# Patient Record
Sex: Male | Born: 1960 | Race: White | Hispanic: No | Smoking: Former smoker
Health system: Southern US, Community
[De-identification: ages and names within clinical notes are randomized; demographics above are authoritative.]

## PROBLEM LIST (undated history)

## (undated) DIAGNOSIS — E1110 Type 2 diabetes mellitus with ketoacidosis without coma: Secondary | ICD-10-CM

## (undated) DIAGNOSIS — A0472 Enterocolitis due to Clostridium difficile, not specified as recurrent: Secondary | ICD-10-CM

## (undated) DIAGNOSIS — F319 Bipolar disorder, unspecified: Secondary | ICD-10-CM

## (undated) DIAGNOSIS — L8990 Pressure ulcer of unspecified site, unspecified stage: Secondary | ICD-10-CM

## (undated) DIAGNOSIS — E119 Type 2 diabetes mellitus without complications: Secondary | ICD-10-CM

## (undated) DIAGNOSIS — G9341 Metabolic encephalopathy: Secondary | ICD-10-CM

## (undated) DIAGNOSIS — J9690 Respiratory failure, unspecified, unspecified whether with hypoxia or hypercapnia: Secondary | ICD-10-CM

---

## 2017-04-15 ENCOUNTER — Emergency Department (HOSPITAL_COMMUNITY): Payer: Medicare Other

## 2017-04-15 ENCOUNTER — Encounter (HOSPITAL_COMMUNITY): Payer: Self-pay | Admitting: *Deleted

## 2017-04-15 ENCOUNTER — Inpatient Hospital Stay (HOSPITAL_COMMUNITY)
Admission: EM | Admit: 2017-04-15 | Discharge: 2017-04-20 | DRG: 177 | Disposition: A | Discharge status: Home Health | Payer: Medicare Other | Attending: Internal Medicine | Admitting: Internal Medicine

## 2017-04-15 DIAGNOSIS — F3111 Bipolar disorder, current episode manic without psychotic features, mild: Secondary | ICD-10-CM | POA: Diagnosis not present

## 2017-04-15 DIAGNOSIS — Z87891 Personal history of nicotine dependence: Secondary | ICD-10-CM | POA: Diagnosis not present

## 2017-04-15 DIAGNOSIS — Z86718 Personal history of other venous thrombosis and embolism: Secondary | ICD-10-CM | POA: Diagnosis not present

## 2017-04-15 DIAGNOSIS — Z659 Problem related to unspecified psychosocial circumstances: Secondary | ICD-10-CM | POA: Diagnosis not present

## 2017-04-15 DIAGNOSIS — Z66 Do not resuscitate: Secondary | ICD-10-CM | POA: Diagnosis not present

## 2017-04-15 DIAGNOSIS — J189 Pneumonia, unspecified organism: Secondary | ICD-10-CM | POA: Diagnosis present

## 2017-04-15 DIAGNOSIS — J181 Lobar pneumonia, unspecified organism: Secondary | ICD-10-CM | POA: Diagnosis not present

## 2017-04-15 DIAGNOSIS — Z794 Long term (current) use of insulin: Secondary | ICD-10-CM

## 2017-04-15 DIAGNOSIS — Z6826 Body mass index (BMI) 26.0-26.9, adult: Secondary | ICD-10-CM

## 2017-04-15 DIAGNOSIS — J3800 Paralysis of vocal cords and larynx, unspecified: Secondary | ICD-10-CM | POA: Diagnosis present

## 2017-04-15 DIAGNOSIS — Z931 Gastrostomy status: Secondary | ICD-10-CM | POA: Diagnosis not present

## 2017-04-15 DIAGNOSIS — Z7901 Long term (current) use of anticoagulants: Secondary | ICD-10-CM

## 2017-04-15 DIAGNOSIS — D649 Anemia, unspecified: Secondary | ICD-10-CM | POA: Diagnosis not present

## 2017-04-15 DIAGNOSIS — E1142 Type 2 diabetes mellitus with diabetic polyneuropathy: Secondary | ICD-10-CM | POA: Diagnosis present

## 2017-04-15 DIAGNOSIS — Z79891 Long term (current) use of opiate analgesic: Secondary | ICD-10-CM

## 2017-04-15 DIAGNOSIS — Z9981 Dependence on supplemental oxygen: Secondary | ICD-10-CM

## 2017-04-15 DIAGNOSIS — Y95 Nosocomial condition: Secondary | ICD-10-CM | POA: Diagnosis present

## 2017-04-15 DIAGNOSIS — J9621 Acute and chronic respiratory failure with hypoxia: Secondary | ICD-10-CM | POA: Diagnosis present

## 2017-04-15 DIAGNOSIS — R1310 Dysphagia, unspecified: Secondary | ICD-10-CM | POA: Diagnosis present

## 2017-04-15 DIAGNOSIS — Z930 Tracheostomy status: Secondary | ICD-10-CM

## 2017-04-15 DIAGNOSIS — G894 Chronic pain syndrome: Secondary | ICD-10-CM | POA: Diagnosis not present

## 2017-04-15 DIAGNOSIS — E860 Dehydration: Secondary | ICD-10-CM | POA: Diagnosis present

## 2017-04-15 DIAGNOSIS — R1110 Vomiting, unspecified: Secondary | ICD-10-CM | POA: Diagnosis not present

## 2017-04-15 DIAGNOSIS — Z95828 Presence of other vascular implants and grafts: Secondary | ICD-10-CM

## 2017-04-15 DIAGNOSIS — G8929 Other chronic pain: Secondary | ICD-10-CM | POA: Diagnosis present

## 2017-04-15 DIAGNOSIS — R109 Unspecified abdominal pain: Secondary | ICD-10-CM

## 2017-04-15 DIAGNOSIS — Z9884 Bariatric surgery status: Secondary | ICD-10-CM | POA: Diagnosis not present

## 2017-04-15 DIAGNOSIS — L89323 Pressure ulcer of left buttock, stage 3: Secondary | ICD-10-CM | POA: Diagnosis present

## 2017-04-15 DIAGNOSIS — R491 Aphonia: Secondary | ICD-10-CM | POA: Diagnosis present

## 2017-04-15 DIAGNOSIS — R531 Weakness: Secondary | ICD-10-CM

## 2017-04-15 DIAGNOSIS — I959 Hypotension, unspecified: Secondary | ICD-10-CM | POA: Diagnosis present

## 2017-04-15 DIAGNOSIS — L89223 Pressure ulcer of left hip, stage 3: Secondary | ICD-10-CM | POA: Diagnosis present

## 2017-04-15 DIAGNOSIS — J9611 Chronic respiratory failure with hypoxia: Secondary | ICD-10-CM | POA: Diagnosis not present

## 2017-04-15 DIAGNOSIS — R8271 Bacteriuria: Secondary | ICD-10-CM | POA: Diagnosis present

## 2017-04-15 DIAGNOSIS — J690 Pneumonitis due to inhalation of food and vomit: Principal | ICD-10-CM | POA: Diagnosis present

## 2017-04-15 DIAGNOSIS — E43 Unspecified severe protein-calorie malnutrition: Secondary | ICD-10-CM | POA: Diagnosis present

## 2017-04-15 DIAGNOSIS — E119 Type 2 diabetes mellitus without complications: Secondary | ICD-10-CM | POA: Diagnosis not present

## 2017-04-15 DIAGNOSIS — F319 Bipolar disorder, unspecified: Secondary | ICD-10-CM | POA: Diagnosis present

## 2017-04-15 DIAGNOSIS — I69354 Hemiplegia and hemiparesis following cerebral infarction affecting left non-dominant side: Secondary | ICD-10-CM

## 2017-04-15 DIAGNOSIS — Z9181 History of falling: Secondary | ICD-10-CM

## 2017-04-15 HISTORY — DX: Enterocolitis due to Clostridium difficile, not specified as recurrent: A04.72

## 2017-04-15 HISTORY — DX: Respiratory failure, unspecified, unspecified whether with hypoxia or hypercapnia: J96.90

## 2017-04-15 HISTORY — DX: Type 2 diabetes mellitus with ketoacidosis without coma: E11.10

## 2017-04-15 HISTORY — DX: Pressure ulcer of unspecified site, unspecified stage: L89.90

## 2017-04-15 HISTORY — DX: Bipolar disorder, unspecified: F31.9

## 2017-04-15 HISTORY — DX: Metabolic encephalopathy: G93.41

## 2017-04-15 HISTORY — DX: Type 2 diabetes mellitus without complications: E11.9

## 2017-04-15 LAB — COMPREHENSIVE METABOLIC PANEL
ALT: 16 U/L — AB (ref 17–63)
ANION GAP: 9 (ref 5–15)
AST: 17 U/L (ref 15–41)
Albumin: 3.1 g/dL — ABNORMAL LOW (ref 3.5–5.0)
Alkaline Phosphatase: 59 U/L (ref 38–126)
BUN: 28 mg/dL — ABNORMAL HIGH (ref 6–20)
CHLORIDE: 101 mmol/L (ref 101–111)
CO2: 29 mmol/L (ref 22–32)
Calcium: 9.1 mg/dL (ref 8.9–10.3)
Creatinine, Ser: 0.53 mg/dL — ABNORMAL LOW (ref 0.61–1.24)
Glucose, Bld: 189 mg/dL — ABNORMAL HIGH (ref 65–99)
POTASSIUM: 4.4 mmol/L (ref 3.5–5.1)
SODIUM: 139 mmol/L (ref 135–145)
Total Bilirubin: 0.4 mg/dL (ref 0.3–1.2)
Total Protein: 7.3 g/dL (ref 6.5–8.1)

## 2017-04-15 LAB — URINALYSIS, ROUTINE W REFLEX MICROSCOPIC
Bilirubin Urine: NEGATIVE
GLUCOSE, UA: NEGATIVE mg/dL
Hgb urine dipstick: NEGATIVE
KETONES UR: 20 mg/dL — AB
LEUKOCYTES UA: NEGATIVE
NITRITE: NEGATIVE
PROTEIN: NEGATIVE mg/dL
Specific Gravity, Urine: 1.031 — ABNORMAL HIGH (ref 1.005–1.030)
pH: 7 (ref 5.0–8.0)

## 2017-04-15 LAB — CBC WITH DIFFERENTIAL/PLATELET
Basophils Absolute: 0 10*3/uL (ref 0.0–0.1)
Basophils Relative: 0 %
EOS ABS: 0.1 10*3/uL (ref 0.0–0.7)
EOS PCT: 0 %
HCT: 31.5 % — ABNORMAL LOW (ref 39.0–52.0)
Hemoglobin: 9.8 g/dL — ABNORMAL LOW (ref 13.0–17.0)
LYMPHS ABS: 1.8 10*3/uL (ref 0.7–4.0)
LYMPHS PCT: 11 %
MCH: 26.7 pg (ref 26.0–34.0)
MCHC: 31.1 g/dL (ref 30.0–36.0)
MCV: 85.8 fL (ref 78.0–100.0)
MONO ABS: 0.5 10*3/uL (ref 0.1–1.0)
Monocytes Relative: 3 %
Neutro Abs: 14.3 10*3/uL — ABNORMAL HIGH (ref 1.7–7.7)
Neutrophils Relative %: 86 %
PLATELETS: 297 10*3/uL (ref 150–400)
RBC: 3.67 MIL/uL — AB (ref 4.22–5.81)
RDW: 15.9 % — AB (ref 11.5–15.5)
WBC: 16.7 10*3/uL — ABNORMAL HIGH (ref 4.0–10.5)

## 2017-04-15 LAB — GLUCOSE, CAPILLARY: GLUCOSE-CAPILLARY: 85 mg/dL (ref 65–99)

## 2017-04-15 LAB — I-STAT CG4 LACTIC ACID, ED: LACTIC ACID, VENOUS: 0.79 mmol/L (ref 0.5–1.9)

## 2017-04-15 LAB — VALPROIC ACID LEVEL: Valproic Acid Lvl: <10 ug/mL — ABNORMAL LOW (ref 50.0–100.0)

## 2017-04-15 MED ORDER — GABAPENTIN 300 MG PO CAPS
300.0000 mg | ORAL_CAPSULE | Freq: Three times a day (TID) | ORAL | Status: DC
  Administered 2017-04-16 – 2017-04-20 (×14): 300 mg
  Filled 2017-04-15 (×14): qty 1

## 2017-04-15 MED ORDER — VANCOMYCIN HCL IN DEXTROSE 1-5 GM/200ML-% IV SOLN
1000.0000 mg | Freq: Two times a day (BID) | INTRAVENOUS | Status: DC
  Administered 2017-04-16 – 2017-04-17 (×4): 1000 mg via INTRAVENOUS
  Filled 2017-04-15 (×4): qty 200

## 2017-04-15 MED ORDER — DEXTROSE 5 % IV SOLN
2.0000 g | Freq: Three times a day (TID) | INTRAVENOUS | Status: DC
  Administered 2017-04-15: 2 g via INTRAVENOUS
  Filled 2017-04-15 (×3): qty 2

## 2017-04-15 MED ORDER — ACETAMINOPHEN 650 MG RE SUPP
650.0000 mg | Freq: Four times a day (QID) | RECTAL | Status: DC | PRN
  Filled 2017-04-15: qty 1

## 2017-04-15 MED ORDER — ONDANSETRON HCL 4 MG PO TABS: 4.0000 mg | ORAL_TABLET | Freq: Four times a day (QID) | ORAL | Status: DC | PRN

## 2017-04-15 MED ORDER — TRAMADOL HCL 50 MG PO TABS
50.0000 mg | ORAL_TABLET | Freq: Four times a day (QID) | ORAL | Status: DC | PRN
  Administered 2017-04-15 – 2017-04-18 (×7): 50 mg
  Filled 2017-04-15 (×8): qty 1

## 2017-04-15 MED ORDER — SACCHAROMYCES BOULARDII 250 MG PO CAPS
250.0000 mg | ORAL_CAPSULE | Freq: Two times a day (BID) | ORAL | Status: DC
  Administered 2017-04-16 – 2017-04-20 (×10): 250 mg
  Filled 2017-04-15 (×10): qty 1

## 2017-04-15 MED ORDER — DIAZEPAM 5 MG/ML IJ SOLN
5.0000 mg | Freq: Once | INTRAMUSCULAR | Status: AC
  Administered 2017-04-15: 5 mg via INTRAVENOUS
  Filled 2017-04-15: qty 2

## 2017-04-15 MED ORDER — SERTRALINE HCL 100 MG PO TABS
100.0000 mg | ORAL_TABLET | Freq: Every day | ORAL | Status: DC
  Administered 2017-04-16 – 2017-04-20 (×5): 100 mg
  Filled 2017-04-15 (×5): qty 1

## 2017-04-15 MED ORDER — SODIUM CHLORIDE 0.9 % IV SOLN
INTRAVENOUS | Status: AC
  Administered 2017-04-16: 12:00:00 via INTRAVENOUS

## 2017-04-15 MED ORDER — ACETAMINOPHEN 160 MG/5ML PO SOLN
500.0000 mg | Freq: Once | ORAL | Status: AC
  Administered 2017-04-15: 500 mg
  Filled 2017-04-15: qty 20

## 2017-04-15 MED ORDER — IOPAMIDOL (ISOVUE-300) INJECTION 61%
100.0000 mL | Freq: Once | INTRAVENOUS | Status: AC | PRN
  Administered 2017-04-15: 100 mL via INTRAVENOUS

## 2017-04-15 MED ORDER — METOCLOPRAMIDE HCL 5 MG PO TABS
5.0000 mg | ORAL_TABLET | Freq: Three times a day (TID) | ORAL | Status: DC
  Administered 2017-04-16 – 2017-04-20 (×15): 5 mg
  Filled 2017-04-15 (×15): qty 1

## 2017-04-15 MED ORDER — IOPAMIDOL (ISOVUE-300) INJECTION 61%
INTRAVENOUS | Status: AC
  Filled 2017-04-15: qty 100

## 2017-04-15 MED ORDER — FAMOTIDINE 20 MG PO TABS
20.0000 mg | ORAL_TABLET | Freq: Two times a day (BID) | ORAL | Status: DC
  Administered 2017-04-16 – 2017-04-20 (×10): 20 mg
  Filled 2017-04-15 (×10): qty 1

## 2017-04-15 MED ORDER — CLONAZEPAM 0.5 MG PO TABS
0.5000 mg | ORAL_TABLET | Freq: Three times a day (TID) | ORAL | Status: DC
  Administered 2017-04-16 – 2017-04-20 (×15): 0.5 mg
  Filled 2017-04-15 (×15): qty 1

## 2017-04-15 MED ORDER — ADULT MULTIVITAMIN W/MINERALS CH
1.0000 | ORAL_TABLET | Freq: Every day | ORAL | Status: DC
  Administered 2017-04-16 – 2017-04-20 (×3): 1 via ORAL
  Filled 2017-04-15 (×5): qty 1

## 2017-04-15 MED ORDER — VANCOMYCIN HCL IN DEXTROSE 1-5 GM/200ML-% IV SOLN
1000.0000 mg | Freq: Once | INTRAVENOUS | Status: AC
  Administered 2017-04-15: 1000 mg via INTRAVENOUS
  Filled 2017-04-15: qty 200

## 2017-04-15 MED ORDER — SODIUM CHLORIDE 0.9 % IV SOLN
1.0000 g | Freq: Three times a day (TID) | INTRAVENOUS | Status: DC
  Administered 2017-04-15: 1 g via INTRAVENOUS
  Filled 2017-04-15 (×2): qty 1

## 2017-04-15 MED ORDER — ONDANSETRON HCL 4 MG/2ML IJ SOLN
4.0000 mg | Freq: Four times a day (QID) | INTRAMUSCULAR | Status: DC | PRN
  Administered 2017-04-19: 4 mg via INTRAVENOUS
  Filled 2017-04-15 (×2): qty 2

## 2017-04-15 MED ORDER — RIVAROXABAN 20 MG PO TABS
20.0000 mg | ORAL_TABLET | Freq: Every day | ORAL | Status: DC
  Administered 2017-04-16 – 2017-04-20 (×5): 20 mg via ORAL
  Filled 2017-04-15 (×5): qty 1

## 2017-04-15 MED ORDER — JEVITY 1.2 CAL PO LIQD
1000.0000 mL | ORAL | Status: DC
  Administered 2017-04-16: 1000 mL
  Filled 2017-04-15 (×3): qty 1000

## 2017-04-15 MED ORDER — ACETAMINOPHEN 325 MG PO TABS
650.0000 mg | ORAL_TABLET | Freq: Four times a day (QID) | ORAL | Status: DC | PRN
  Administered 2017-04-16: 650 mg via ORAL
  Filled 2017-04-15: qty 2

## 2017-04-15 MED ORDER — PANTOPRAZOLE SODIUM 40 MG PO PACK
40.0000 mg | PACK | Freq: Every day | ORAL | Status: DC
  Administered 2017-04-16 – 2017-04-20 (×5): 40 mg
  Filled 2017-04-15 (×6): qty 20

## 2017-04-15 MED ORDER — FENTANYL 12 MCG/HR TD PT72
12.0000 ug | MEDICATED_PATCH | TRANSDERMAL | Status: DC
  Administered 2017-04-16 – 2017-04-19 (×2): 12.5 ug via TRANSDERMAL
  Filled 2017-04-15 (×2): qty 1

## 2017-04-15 MED ORDER — SODIUM CHLORIDE 0.9 % IV BOLUS (SEPSIS)
1000.0000 mL | Freq: Once | INTRAVENOUS | Status: AC
  Administered 2017-04-15: 1000 mL via INTRAVENOUS

## 2017-04-15 MED ORDER — INSULIN ASPART 100 UNIT/ML ~~LOC~~ SOLN
0.0000 [IU] | Freq: Three times a day (TID) | SUBCUTANEOUS | Status: DC
  Administered 2017-04-16: 3 [IU] via SUBCUTANEOUS
  Administered 2017-04-16 – 2017-04-19 (×3): 2 [IU] via SUBCUTANEOUS

## 2017-04-15 MED ORDER — IPRATROPIUM-ALBUTEROL 0.5-2.5 (3) MG/3ML IN SOLN
3.0000 mL | Freq: Four times a day (QID) | RESPIRATORY_TRACT | Status: DC
  Administered 2017-04-15 – 2017-04-17 (×7): 3 mL via RESPIRATORY_TRACT
  Filled 2017-04-15 (×7): qty 3

## 2017-04-15 MED ORDER — VALPROATE SODIUM 250 MG/5ML PO SOLN
500.0000 mg | Freq: Two times a day (BID) | ORAL | Status: DC
  Administered 2017-04-16 – 2017-04-20 (×10): 500 mg
  Filled 2017-04-15 (×10): qty 10

## 2017-04-15 MED ORDER — QUETIAPINE FUMARATE 25 MG PO TABS
50.0000 mg | ORAL_TABLET | Freq: Two times a day (BID) | ORAL | Status: DC
  Administered 2017-04-16 – 2017-04-20 (×10): 50 mg
  Filled 2017-04-15 (×10): qty 2

## 2017-04-15 NOTE — ED Notes (Signed)
Bed: WA03 Expected date:  Expected time:  Means of arrival:  Comments: EMS-aggressive behavior

## 2017-04-15 NOTE — Progress Notes (Signed)
Pharmacy Antibiotic Note  Cory HarpsRonald Montoya is a 56 y.o. male admitted on 04/15/2017 with pneumonia. Pt presents from [facility] for aggressive behavior, recently finished abx for pna.   Pharmacy has been consulted for vancomycin dosing. Vanc 1gm x 1 given earlier today.  Plan: Vancomycin 1000mg  IV q12h Aztreonam per MD Follow renal function, cultures and clinical course  Weight: 176 lb (79.8 kg)  Temp (24hrs), Avg:98.6 F (37 C), Min:97.6 F (36.4 C), Max:100.1 F (37.8 C)   Recent Labs Lab 04/15/17 1159 04/15/17 1228  WBC 16.7*  --   CREATININE 0.53*  --   LATICACIDVEN  --  0.79    CrCl cannot be calculated (Unknown ideal weight.).    Allergies  Allergen Reactions  . Codeine     Unknown reaction per MAR   . Keflex [Cephalexin]     Unknown reaction per MAR   . Ketorolac     [facility]     Antimicrobials this admission: 6/13 merrem x1 6/13 aztreonam>> 6/13 vancomycin >> Dose adjustments this admission:   Microbiology results: 6/13 BCx: sent 6/13 UCx: sent 6/13 CDiff: sent  Thank you for allowing pharmacy to be a part of this patient's care.  Arley PhenixEllen Jaedan Schuman RPh 04/15/2017, 7:07 PM Pager [phone]

## 2017-04-15 NOTE — ED Notes (Signed)
Mardene SpeakBrandi Allen, director of nursing at [facility] (phone [phone]), called to report pt will not be coming back to [facility]. [facility]. Juan[facility] Gosling[facility]oline Crawley at [facility] is aware of pt (phone [phone]). AC [facility], Stacy Wilkins (phone [phone]), is aware of pt and can be contacted as well regarding pt placement after discharge.

## 2017-04-15 NOTE — ED Notes (Signed)
Pt transported to CT ?

## 2017-04-15 NOTE — Progress Notes (Signed)
Pt stoma site clean and covered with bandage.  Pt states that he does not need any respiratory interventions at this time.

## 2017-04-15 NOTE — H&P (Signed)
[facility] History and Physical  Cory Montoya JXB:[id number] DOB: 1961-08-10 DOA: 04/15/2017   PCP: Unknown Specialists: Unknown  Chief Complaint: Bizarre behavior at [facility]  HPI: Cory Montoya is a 56 y.o. male with a past medical history of chronic respiratory failure, tracheostomy, PEG tube, DVT, diabetes, bipolar disorder who was at the [facility] long[facility] facility and who was sent over here due to bizarre behavior over the last few days. Patient has apparently been more combative and was making sexual comments to the staff over in that facility. Patient's dose of Seroquel was increased. He was also placed on valproic acid. Apparently, patient was on IV antibiotics for pneumonia and finished a course 2-3 weeks ago. He still has a PICC line. He also has a history of C. difficile, which has been treated previously. Most of this information is obtained from nursing staff as well as notes. No family is at bedside. Patient unable to provide much of a history. He appears to be quite emotionally labile. Starts crying. It appears that patient was admitted to [facility] from [facility]. He had to be intubated. Could not be weaned off and had to have a tracheostomy tube placed. He also has a PEG tube. At the [facility] facility he was weaned off of his ventilator. He is currently on trach collar. Patient complains of pain in his buttock area where he has wounds.  In the emergency department, patient was evaluated. He was noted to be borderline hypotensive and was noted to have a low-grade fever and was mildly tachycardic. CT scan of the chest, abdomen, pelvis was done and it revealed evidence for pneumonia right more than left. CT scan of the head did not show any acute findings. He was noted to have elevated WBC. No diarrhea has been reported.  Home Medications: Prior to Admission medications   Medication Sig Start Date End Date Taking? Authorizing  Provider  acetaminophen (TYLENOL) 325 MG tablet Take 650 mg by mouth every 8 (eight) hours as needed (for pain).   Yes [provider]  alum & mag hydroxide-simeth (MAALOX PLUS) 400-400-40 MG/5ML suspension Take 30 mLs by mouth every 6 (six) hours as needed for indigestion.   Yes [provider]  chlorhexidine (PERIDEX) 0.12 % solution Use as directed 15 mLs in the mouth or throat 2 (two) times daily. Started 03/30 finish on 06/13   Yes [provider]  clonazePAM (KLONOPIN) 0.5 MG tablet Place 0.5 mg into feeding tube every 8 (eight) hours.   Yes [provider]  diphenhydrAMINE (BENADRYL) 25 MG tablet Take 25 mg by mouth every 8 (eight) hours as needed for itching.   Yes [provider]  divalproex (DEPAKOTE) 500 MG DR tablet Take 500 mg by mouth every 12 (twelve) hours.   Yes [provider]  famotidine (PEPCID) 20 MG tablet Place 20 mg into feeding tube every 12 (twelve) hours.   Yes [provider]  fentaNYL (DURAGESIC - DOSED MCG/HR) 12 MCG/HR Place 12 mcg onto the skin every 3 (three) days.   Yes [provider]  furosemide (LASIX) 20 MG tablet Place 20 mg into feeding tube daily with breakfast.   Yes [provider]  gabapentin (NEURONTIN) 300 MG capsule Place 300 mg into feeding tube every 8 (eight) hours.   Yes [provider]  insulin lispro (HUMALOG KWIKPEN) 100 UNIT/ML KiwkPen Inject 2-10 Units into the skin 4 (four) times daily -  before meals and  at bedtime. Per Sliding Scale: If BS 151-200 = 2 units, 201-250 = 4 units, 251-300 = 6 units, 301-350 = 8 units, 351-400 = 10 units, Over 400 call MD   Yes [provider]  ipratropium-albuterol (DUONEB) 0.5-2.5 (3) MG/3ML SOLN Take 3 mLs by nebulization every 6 (six) hours.   Yes [provider]  loperamide (IMODIUM A-D) 2 MG tablet Take 2 mg by mouth as needed for diarrhea or loose stools.   Yes [provider]  metoCLOPramide  (REGLAN) 5 MG tablet Place 5 mg into feeding tube every 8 (eight) hours.   Yes [provider]  metoprolol succinate (TOPROL-XL) 25 MG 24 hr tablet Take 12.5 mg by mouth every 12 (twelve) hours.   Yes [provider]  Multiple Vitamin (MULTIVITAMIN WITH MINERALS) TABS tablet Take 1 tablet by mouth daily with breakfast.   Yes [provider]  Nutritional Supplements (FEEDING SUPPLEMENT, GLUCERNA 1.5 CAL,) LIQD Place 180 mL/hr into feeding tube every 4 (four) hours.   Yes [provider]  oxyCODONE-acetaminophen (PERCOCET/ROXICET) 5-325 MG tablet Take 1 tablet by mouth every 12 (twelve) hours as needed (for pain).   Yes [provider]  OXYGEN Inhale into the lungs continuous as needed (for shortness of breath).   Yes [provider]  pantoprazole sodium (PROTONIX) 40 mg/20 mL PACK Place 40 mg into feeding tube daily before breakfast.   Yes [provider]  promethazine (PHENERGAN) 25 MG tablet Take 25 mg by mouth every 8 (eight) hours as needed for nausea or vomiting.   Yes [provider]  Protein POWD Take 1 scoop by mouth every 6 (six) hours.   Yes [provider]  QUEtiapine (SEROQUEL) 50 MG tablet Place 50 mg into feeding tube every 12 (twelve) hours.   Yes [provider]  rivaroxaban (XARELTO) 20 MG TABS tablet Take 20 mg by mouth daily with breakfast.   Yes [provider]  sertraline (ZOLOFT) 100 MG tablet Place 100 mg into feeding tube daily before breakfast.   Yes [provider]  sodium chloride 0.9 % injection Inject 10 mLs into the vein 2 (two) times daily. Every shift   Yes [provider]  Sodium Chloride, Inhalant, 7 % NEBU 1 vial by Intratracheal route every 6 (six) hours.   Yes [provider]  traMADol (ULTRAM) 50 MG tablet Place 50 mg into feeding tube every 8 (eight) hours.   Yes [provider]  Valproate Sodium (VALPROIC ACID) 250 MG/5ML SOLN  Place 500 mg into feeding tube every 12 (twelve) hours.   Yes [provider]    Allergies:  Allergies  Allergen Reactions  . Codeine     Unknown reaction per MAR   . Keflex [Cephalexin]     Unknown reaction per MAR   . Ketorolac     Unknown reaction per [facility]     Past Medical History: Past Medical History:  Diagnosis Date  . Bipolar disorder (HCC)   . C. difficile diarrhea   . Diabetes mellitus without complication (HCC)   . DKA (diabetic ketoacidoses) (HCC)   . Metabolic encephalopathy   . Pressure ulcer   . Respiratory failure (HCC)     History reviewed. No pertinent surgical history.  Social History: He is here from [facility]. No other information is available at this time.   Family History:  unable to do as the patient is unable to tell me due to his mental illness   Review  of Systems - unable to do due to his mental condition  Physical Examination  Vitals:   04/15/17 1156 04/15/17 1156 04/15/17 1357 04/15/17 1626  BP:   102/60 107/78  Pulse:   77 84  Resp:   18 (!) 22  Temp: 100.1 F (37.8 C)     TempSrc: Rectal     SpO2:   100% 97%  Weight:  79.8 kg (176 lb)      BP 107/78 (BP Location: Left Arm)   Pulse 84   Temp 100.1 F (37.8 C) (Rectal)   Resp (!) 22   Wt 79.8 kg (176 lb)   SpO2 97%   General appearance: alert, distracted and no distress Head: Normocephalic, without obvious abnormality, atraumatic Eyes: conjunctivae/corneas clear. PERRL, EOM's intact.  Throat: Dry mucous membranes Neck: Tracheostomy is noted Back: symmetric, no curvature. ROM normal. No CVA tenderness. Resp: Crackles and rhonchi right base. No wheezing. Mildly tachypneic Cardio: regular rate and rhythm, S1, S2 normal, no murmur, click, rub or gallop GI: soft, non-tender; bowel sounds normal; no masses,  no organomegaly and PG tube is noted Extremities: extremities normal, atraumatic, no cyanosis or edema Pulses: 2+ and symmetric Skin: Stage II  decubiti noted in the buttock and sacral area Lymph nodes: Cervical, supraclavicular, and axillary nodes normal. Neurologic: Awake, alert. Moving all his extremities. No facial asymmetry.   Labs on Admission: I have personally reviewed following labs and imaging studies  CBC:  Recent Labs Lab 04/15/17 1159  WBC 16.7*  NEUTROABS 14.3*  HGB 9.8*  HCT 31.5*  MCV 85.8  PLT 297   Basic Metabolic Panel:  Recent Labs Lab 04/15/17 1159  NA 139  K 4.4  CL 101  CO2 29  GLUCOSE 189*  BUN 28*  CREATININE 0.53*  CALCIUM 9.1   GFR: CrCl cannot be calculated (Unknown ideal weight.). Liver Function Tests:  Recent Labs Lab 04/15/17 1159  AST 17  ALT 16*  ALKPHOS 59  BILITOT 0.4  PROT 7.3  ALBUMIN 3.1*    Urine analysis:    Component Value Date/Time   COLORURINE AMBER (A) 04/15/2017 1134   APPEARANCEUR HAZY (A) 04/15/2017 1134   LABSPEC 1.031 (H) 04/15/2017 1134   PHURINE 7.0 04/15/2017 1134   GLUCOSEU NEGATIVE 04/15/2017 1134   HGBUR NEGATIVE 04/15/2017 1134   BILIRUBINUR NEGATIVE 04/15/2017 1134   KETONESUR 20 (A) 04/15/2017 1134   PROTEINUR NEGATIVE 04/15/2017 1134   NITRITE NEGATIVE 04/15/2017 1134   LEUKOCYTESUR NEGATIVE 04/15/2017 1134    Radiological Exams on Admission: Ct Head Wo Contrast  Result Date: 04/15/2017 CLINICAL DATA:  Aggressive behavior. Patient found on floor today. Hx of DKA ^175mL ISOVUE-300 IOPAMIDOL (ISOVUE-300) INJECTION 61% EXAM: CT HEAD WITHOUT CONTRAST TECHNIQUE: Contiguous axial images were obtained from the base of the skull through the vertex without intravenous contrast. COMPARISON:  None. FINDINGS: Brain: There is mild generalized parenchymal atrophy with commensurate dilatation of the ventricles and sulci. Mild chronic small vessel ischemic changes noted within the deep periventricular white matter. There is no mass, hemorrhage, edema or other evidence of acute parenchymal abnormality. No extra-axial hemorrhage. Vascular: There are  chronic calcified atherosclerotic changes of the large vessels at the skull base. No unexpected hyperdense vessel. Skull: Normal. Negative for fracture or focal lesion. Sinuses/Orbits: Mucosal thickening within the sphenoid sinus and right maxillary sinus, of uncertain chronicity. Chronic appearing mastoid fluid bilaterally. Periorbital and retro-orbital soft tissues are unremarkable. Other: None. IMPRESSION: 1. No acute intracranial abnormality. No intracranial mass, hemorrhage or edema. 2.  Mild paranasal sinus disease, of uncertain chronicity, most likely chronic. Electronically Signed   By: Bary RichardStan  Maynard M.D.   On: 04/15/2017 15:24   Ct Chest W Contrast  Result Date: 04/15/2017 CLINICAL DATA:  Status post fall last night x2. Altered mental status. EXAM: CT CHEST, ABDOMEN, AND PELVIS WITH CONTRAST TECHNIQUE: Multidetector CT imaging of the chest, abdomen and pelvis was performed following the standard protocol during bolus administration of intravenous contrast. CONTRAST:  100 ml ISOVUE-300 IOPAMIDOL (ISOVUE-300) INJECTION 61% COMPARISON:  Single-view of the chest earlier today. FINDINGS: CT CHEST FINDINGS Cardiovascular: Heart size is normal. No pericardial effusion. A few scattered aortic atherosclerotic calcifications are identified. Right PICC is noted with its tip in the lower superior vena cava. Mediastinum/Nodes: No enlarged mediastinal, hilar or axillary lymph nodes. Thyroid gland, trachea, and esophagus demonstrate no significant findings. Lungs/Pleura: No pleural effusion. Patchy airspace disease is seen in the upper lobes bilaterally. There is dense right lower lobe airspace disease with air bronchograms. Micronodularity is seen and most conspicuous in the right upper lobe, right middle lobe and lingula. Scattered peribronchial thickening is also identified. Musculoskeletal: No acute bony abnormality is identified. 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 No lytic or sclerotic lesion. CT ABDOMEN PELVIS FINDINGS Hepatobiliary: The patient is status post cholecystectomy. Mild intrahepatic biliary ductal dilatation is likely related to gallbladder removal. No focal liver lesion. Pancreas: The pancreas demonstrates some atrophy. No focal lesion or pancreatic duct dilatation. Spleen: Normal in size without focal abnormality. Adrenals/Urinary Tract: Adrenal glands are unremarkable. Kidneys are normal, without renal calculi, focal lesion, or hydronephrosis. Bladder is unremarkable. Stomach/Bowel: Feeding tube is in place with the tip in the stomach. The patient is status post gastric bypass without evidence complication. The colon appears normal. No evidence of appendicitis. Vascular/Lymphatic: Scattered atherosclerosis without aneurysm. No lymphadenopathy. Reproductive: Prostate is unremarkable. Other: No fluid collection. Metallic densities projecting in the right lower quadrant could be due to prior surgery or trauma. Musculoskeletal: No acute or focal bony abnormality. IMPRESSION: Extensive bilateral airspace disease worst in the right lower lobe consistent with pneumonia. Extensive micro nodularity bilaterally raise the possibility of atypical infection. No other acute abnormality chest, abdomen or pelvis. Atherosclerosis. Status post cholecystectomy and gastric bypass. Electronically Signed   By: Drusilla Kannerhomas  Dalessio M.D.   On: 04/15/2017 15:31   Ct Abdomen Pelvis W Contrast  Result Date: 04/15/2017 CLINICAL DATA:  Status post fall last night x2. Altered mental status. EXAM: CT CHEST, ABDOMEN, AND PELVIS WITH CONTRAST TECHNIQUE: Multidetector CT imaging of the chest, abdomen and pelvis was performed following the standard protocol during bolus administration of intravenous contrast. CONTRAST:  100 ml ISOVUE-300 IOPAMIDOL (ISOVUE-300) INJECTION 61% COMPARISON:  Single-view of the chest earlier today. FINDINGS: CT CHEST FINDINGS Cardiovascular: Heart  size is normal. No pericardial effusion. A few scattered aortic atherosclerotic calcifications are identified. Right PICC is noted with its tip in the lower superior vena cava. Mediastinum/Nodes: No enlarged mediastinal, hilar or axillary lymph nodes. Thyroid gland, trachea, and esophagus demonstrate no significant findings. Lungs/Pleura: No pleural effusion. Patchy airspace disease is seen in the upper lobes bilaterally. There is dense right lower lobe airspace disease with air bronchograms. Micronodularity is seen and most conspicuous in the right upper lobe, right middle lobe and lingula. Scattered peribronchial thickening is also identified. Musculoskeletal: No acute bony abnormality is identified. 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 No lytic or sclerotic  lesion. CT ABDOMEN PELVIS FINDINGS Hepatobiliary: The patient is status post cholecystectomy. Mild intrahepatic biliary ductal dilatation is likely related to gallbladder removal. No focal liver lesion. Pancreas: The pancreas demonstrates some atrophy. No focal lesion or pancreatic duct dilatation. Spleen: Normal in size without focal abnormality. Adrenals/Urinary Tract: Adrenal glands are unremarkable. Kidneys are normal, without renal calculi, focal lesion, or hydronephrosis. Bladder is unremarkable. Stomach/Bowel: Feeding tube is in place with the tip in the stomach. The patient is status post gastric bypass without evidence complication. The colon appears normal. No evidence of appendicitis. Vascular/Lymphatic: Scattered atherosclerosis without aneurysm. No lymphadenopathy. Reproductive: Prostate is unremarkable. Other: No fluid collection. Metallic densities projecting in the right lower quadrant could be due to prior surgery or trauma. Musculoskeletal: No acute or focal bony abnormality. IMPRESSION: Extensive bilateral airspace disease worst in the right lower lobe consistent with pneumonia.  Extensive micro nodularity bilaterally raise the possibility of atypical infection. No other acute abnormality chest, abdomen or pelvis. Atherosclerosis. Status post cholecystectomy and gastric bypass. Electronically Signed   By: Drusilla Kanner M.D.   On: 04/15/2017 15:31   Dg Chest Port 1 View  Result Date: 04/15/2017 CLINICAL DATA:  56 year old male with cough fever and weakness today. EXAM: PORTABLE CHEST 1 VIEW COMPARISON:  None. FINDINGS: Portable AP semi upright view at 1230 hours. Right PICC line is in place. There is confluent somewhat masslike opacity at the right lung base with evidence of right lung volume loss. The patient is also mildly rotated to the right. No pneumothorax or pulmonary edema. The left lung is clear allowing for portable technique. Mild if any cardiomegaly. Other mediastinal contours are within normal limits. Visualized tracheal air column is within normal limits. Epigastric and right upper quadrant surgical clips. Negative visible bowel gas pattern. IMPRESSION: 1. A right PICC line is in place. 2. Abnormal right lung base, but nonspecific. There is evidence of some right lung volume loss. This could reflect a combination of any of the following: Pneumonia, lung mass, pleural effusion, lower lobe collapse. Chest CT (IV contrast preferred) would characterize further. 3. Left lung appears clear. Electronically Signed   By: Odessa Fleming M.D.   On: 04/15/2017 13:02      Problem List  Principal Problem:   Aspiration pneumonia (HCC) Active Problems:   Bipolar affective disorder, manic, mild (HCC)   Tracheostomy present (HCC)   PEG (percutaneous endoscopic gastrostomy) status (HCC)   Chronic respiratory failure with hypoxia (HCC)   Normocytic anemia   Diabetes mellitus type 2 in nonobese [facility])   Assessment: This is a 56 year old Caucasian male with past medical history as stated earlier, who is here from a long-term care facility with bizarre behavior. Patient seems to be  appropriate at this time but still confused. He is noted to have pneumonia on imaging studies. He might have aspirated. This could also be healthcare associated pneumonia.   Plan: #1 healthcare associated pneumonia versus aspiration pneumonia: It appears the patient is nothing by mouth status. He gets all feedings through his PEG tube. He does not take anything by mouth. He will be given broad-spectrum antibiotic coverage for now due to recent hospitalization and the fact that he is in a long-term acute facility. He'll be given vancomycin and aztreonam. Follow up on cultures.  #2 history of C. difficile: No history of diarrhea recently. Looks like he was treated for C. difficile while he was at [facility]. Probiotics will be utilized.  #3 Sacral and buttock decubitus, stage II: Wound care nurse  will be consulted.  #4 history of DVT: Continue Xarelto.   #5 Normocytic anemia: Baseline hemoglobin is not known. No evidence for overt bleeding. Continue to monitor hemoglobin. Check anemia panel  #6 Dysphagia: Secondary to tracheostomy. Speech therapy to see. He is on tube feedings. Consult dietitian. Tube feedings will be initiated at low rate. Check residuals. Continue Reglan  #7 chronic respiratory failure with hypoxia: He has a tracheostomy. Tracheostomy care will be ordered.  #8 history of diabetes mellitus type 2: He is not noted to be on any long-term medications. SSI. HbA1c  #9 Chronic pain: Noted to be on a fentanyl patch, which will be continued for now.  #10 History of bipolar disorder with recent bizarre behavior: CT head did not show any acute findings. Continue Seroquel, valproic acid. He is also noted to be on gabapentin. Valproic acid level is pending.  DVT Prophylaxis: On anticoagulation  Code Status: Full code  Family Communication: No family at bedside  Disposition Plan: It appears that he will be going to a skilled nursing facility from here  Consults called: None  Admission  status: Inpatient   Severity of Illness: The appropriate patient status for this patient is INPATIENT. Inpatient status is judged to be reasonable and necessary in order to provide the required intensity of service to ensure the patient's safety. The patient's presenting symptoms, physical exam findings, and initial radiographic and laboratory data in the context of their chronic comorbidities is felt to place them at high risk for further clinical deterioration. Furthermore, it is not anticipated that the patient will be medically stable for discharge from the hospital within 2 midnights of admission. The following factors support the patient status of inpatient.   " The patient's presenting symptoms include bizarre behavior. " The worrisome physical exam findings include crackles in the lungs. " The initial radiographic and laboratory data are worrisome because of pneumonia. " The chronic co-morbidities include chronic respiratory failure, dysphagia.   * I certify that at the point of admission it is my clinical judgment that the patient will require inpatient hospital care spanning beyond 2 midnights from the point of admission due to high intensity of service, high risk for further deterioration and high frequency of surveillance required.*  Further management decisions will depend on results of further testing and patient's response to treatment.   [facility]  [facility] Pager [phone]  If 7PM-7AM, please contact night-coverage [email or web address] Password [email or web address]  04/15/2017, 5:09 PM

## 2017-04-15 NOTE — ED Triage Notes (Addendum)
Per EMS, pt sent from [facility] for psychiatric evaluation. Staff state the pt has become increasingly combative. Pt has sacral ulcer since February. Pt A&Ox4.   EMS vitals BP 116/88 HR 100 O2 95% on 6L RR 18 CBG 179  This nurse spoke to staff at [facility], who states the pt became more combative 3-4 days ago. Nurse states the pt was not following directions, calling 911, and making sexual comments to staff. Nurse states the pt fell twice last night after being told not to get out of bed without assistance. Nurse states the pt can appear normal but has outbursts at times. Pt's seroquel was increased yesterday and pt placed on valproic acid. Pt was on IV abx for pneumonia, finished 2-3 weeks ago. Nurse has not noticed pt having diarrhea.  Nurse at [facility] is Mya, phone #[phone].

## 2017-04-15 NOTE — ED Provider Notes (Signed)
[facility]-EMERGENCY DEPT Provider Note   CSN: [id number] Arrival date & time: 04/15/17  1103     History   Chief Complaint Chief Complaint  Patient presents with  . Aggressive Behavior  . Psychiatric Evaluation    HPI Cory Montoya is a 56 y.o. 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 here presenting with aggressive behavior, diarrhea, altered mental status. Patient had a complicated admission in March after a hemorrhagic stroke and was intubated and eventually had a trach and G-tube. He was transferred to [facility] and has been there since then. Patient eventually developed C. difficile and finished by mouth vancomycin several weeks ago. He also has pneumonia and apparently had the PICC line and recently finished abx (unclear from the [facility] when they are). Over the last several days, the staff noticed that he has been confused and altered. He had made some sexual remarks to the staff and has been aggressive and agitated. He was placed on Depakote and Seroquel was increased yesterday. Also had an episode of diarrhea yesterday. Sent here for evaluation.  The history is provided by the patient and the EMS personnel. The history is limited by the condition of the patient.    Past Medical History:  Diagnosis Date  . Bipolar disorder (HCC)   . C. difficile diarrhea   . Diabetes mellitus without complication (HCC)   . DKA (diabetic ketoacidoses) (HCC)   . Metabolic encephalopathy   . Pressure ulcer   . Respiratory failure [facility])     Patient Active Problem List   Diagnosis Date Noted  . Bipolar affective disorder, manic, mild (HCC) 04/15/2017    No past surgical history on file.     Home Medications    Prior to Admission medications   Medication Sig Start Date End Date Taking? Authorizing Provider  acetaminophen (TYLENOL) 325 MG tablet Take 650 mg by mouth every 8 (eight) hours as needed (for pain).   Yes [provider]  alum & mag hydroxide-simeth (MAALOX PLUS) 400-400-40 MG/5ML suspension Take 30 mLs by mouth every 6 (six) hours as needed for indigestion.   Yes [provider]  chlorhexidine (PERIDEX) 0.12 % solution Use as directed 15 mLs in the mouth or throat 2 (two) times daily. Started 03/30 finish on 06/13   Yes [provider]  clonazePAM (KLONOPIN) 0.5 MG tablet Place 0.5 mg into feeding tube every 8 (eight) hours.   Yes [provider]  diphenhydrAMINE (BENADRYL) 25 MG tablet Take 25 mg by mouth every 8 (eight) hours as needed for itching.   Yes [provider]  divalproex (DEPAKOTE) 500 MG DR tablet Take 500 mg by mouth every 12 (twelve) hours.   Yes [provider]  famotidine (PEPCID) 20 MG tablet Place 20 mg into feeding tube every 12 (twelve) hours.   Yes [provider]  fentaNYL (DURAGESIC - DOSED MCG/HR) 12 MCG/HR Place 12 mcg onto the skin every 3 (three) days.   Yes [provider]  furosemide (LASIX) 20 MG tablet Place 20 mg into feeding tube daily with breakfast.   Yes [provider]  gabapentin (NEURONTIN) 300 MG capsule Place 300 mg into feeding tube every 8 (eight) hours.   Yes [provider]  insulin lispro (HUMALOG KWIKPEN) 100 UNIT/ML KiwkPen Inject 2-10 Units into the skin 4 (four) times daily -  before meals and at bedtime. Per Sliding Scale: If BS 151-200 = 2 units, 201-250 = 4  units, 251-300 = 6 units, 301-350 = 8 units, 351-400 = 10 units, Over 400 call MD   Yes [provider]  ipratropium-albuterol (DUONEB) 0.5-2.5 (3) MG/3ML SOLN Take 3 mLs by nebulization every 6 (six) hours.   Yes [provider]  loperamide (IMODIUM A-D) 2 MG tablet Take 2 mg by mouth as needed for diarrhea or loose stools.   Yes [provider]  metoCLOPramide (REGLAN) 5 MG tablet Place 5 mg into feeding tube every 8 (eight) hours.   Yes [provider]  metoprolol succinate  (TOPROL-XL) 25 MG 24 hr tablet Take 12.5 mg by mouth every 12 (twelve) hours.   Yes [provider]  Multiple Vitamin (MULTIVITAMIN WITH MINERALS) TABS tablet Take 1 tablet by mouth daily with breakfast.   Yes [provider]  Nutritional Supplements (FEEDING SUPPLEMENT, GLUCERNA 1.5 CAL,) LIQD Place 180 mL/hr into feeding tube every 4 (four) hours.   Yes [provider]  oxyCODONE-acetaminophen (PERCOCET/ROXICET) 5-325 MG tablet Take 1 tablet by mouth every 12 (twelve) hours as needed (for pain).   Yes [provider]  OXYGEN Inhale into the lungs continuous as needed (for shortness of breath).   Yes [provider]  pantoprazole sodium (PROTONIX) 40 mg/20 mL PACK Place 40 mg into feeding tube daily before breakfast.   Yes [provider]  promethazine (PHENERGAN) 25 MG tablet Take 25 mg by mouth every 8 (eight) hours as needed for nausea or vomiting.   Yes [provider]  Protein POWD Take 1 scoop by mouth every 6 (six) hours.   Yes [provider]  QUEtiapine (SEROQUEL) 50 MG tablet Place 50 mg into feeding tube every 12 (twelve) hours.   Yes [provider]  rivaroxaban (XARELTO) 20 MG TABS tablet Take 20 mg by mouth daily with breakfast.   Yes [provider]  sertraline (ZOLOFT) 100 MG tablet Place 100 mg into feeding tube daily before breakfast.   Yes [provider]  sodium chloride 0.9 % injection Inject 10 mLs into the vein 2 (two) times daily. Every shift   Yes [provider]  Sodium Chloride, Inhalant, 7 % NEBU 1 vial by Intratracheal route every 6 (six) hours.   Yes [provider]  traMADol (ULTRAM) 50 MG tablet Place 50 mg into feeding tube every 8 (eight) hours.   Yes [provider]  Valproate Sodium (VALPROIC ACID) 250 MG/5ML SOLN Place 500 mg into feeding tube every 12 (twelve) hours.   Yes [provider]    Family History No family history on  file.  Social History Social History  Substance Use Topics  . Smoking status: Former Games developermoker  . Smokeless tobacco: Never Used  . Alcohol use No     Allergies   Codeine; Keflex [cephalexin]; and Ketorolac   Review of Systems Review of Systems  Psychiatric/Behavioral: Positive for confusion.  All other systems reviewed and are negative.    Physical Exam Updated Vital Signs BP 102/60 (BP Location: Left Arm)   Pulse 77   Temp 100.1 F (37.8 C) (Rectal)   Resp 18   Wt 79.8 kg (176 lb)   SpO2 100%   Physical Exam  Constitutional:  Chronically ill   HENT:  Head: Normocephalic.  MM dry   Eyes: Conjunctivae and EOM are normal. Pupils are equal, round, and reactive to light.  Neck: Normal range of motion. Neck supple.  Trach site closing, no obvious discharge   Cardiovascular: Normal rate, regular rhythm and  normal heart sounds.   Pulmonary/Chest:  Diminished bilateral bases   Abdominal: Soft.  G tube in place. Mild RUQ and RLQ tenderness   Musculoskeletal: Normal range of motion.  Stage 2 sacral decub and stage 2 L buttock decub, no obvious cellulitis   Neurological: He is alert.  Slightly confused. Moving all extremities   Skin: Skin is warm. There is erythema.  Psychiatric:  Unable   Nursing note and vitals reviewed.    ED Treatments / Results  Labs (all labs ordered are listed, but only abnormal results are displayed) Labs Reviewed  CBC WITH DIFFERENTIAL/PLATELET - Abnormal; Notable for the following:       Result Value   WBC 16.7 (*)    RBC 3.67 (*)    Hemoglobin 9.8 (*)    HCT 31.5 (*)    RDW 15.9 (*)    Neutro Abs 14.3 (*)    All other components within normal limits  COMPREHENSIVE METABOLIC PANEL - Abnormal; Notable for the following:    Glucose, Bld 189 (*)    BUN 28 (*)    Creatinine, Ser 0.53 (*)    Albumin 3.1 (*)    ALT 16 (*)    All other components within normal limits  URINALYSIS, ROUTINE W REFLEX MICROSCOPIC - Abnormal; Notable for  the following:    Color, Urine AMBER (*)    APPearance HAZY (*)    Specific Gravity, Urine 1.031 (*)    Ketones, ur 20 (*)    All other components within normal limits  CULTURE, BLOOD (ROUTINE X 2)  CULTURE, BLOOD (ROUTINE X 2)  C DIFFICILE QUICK SCREEN W PCR REFLEX  URINE CULTURE  VALPROIC ACID LEVEL  I-STAT CG4 LACTIC ACID, ED    EKG  EKG Interpretation None       Radiology Ct Head Wo Contrast  Result Date: 04/15/2017 CLINICAL DATA:  Aggressive behavior. Patient found on floor today. Hx of DKA ^121mL ISOVUE-300 IOPAMIDOL (ISOVUE-300) INJECTION 61% EXAM: CT HEAD WITHOUT CONTRAST TECHNIQUE: Contiguous axial images were obtained from the base of the skull through the vertex without intravenous contrast. COMPARISON:  None. FINDINGS: Brain: There is mild generalized parenchymal atrophy with commensurate dilatation of the ventricles and sulci. Mild chronic small vessel ischemic changes noted within the deep periventricular white matter. There is no mass, hemorrhage, edema or other evidence of acute parenchymal abnormality. No extra-axial hemorrhage. Vascular: There are chronic calcified atherosclerotic changes of the large vessels at the skull base. No unexpected hyperdense vessel. Skull: Normal. Negative for fracture or focal lesion. Sinuses/Orbits: Mucosal thickening within the sphenoid sinus and right maxillary sinus, of uncertain chronicity. Chronic appearing mastoid fluid bilaterally. Periorbital and retro-orbital soft tissues are unremarkable. Other: None. IMPRESSION: 1. No acute intracranial abnormality. No intracranial mass, hemorrhage or edema. 2. Mild paranasal sinus disease, of uncertain chronicity, most likely chronic. Electronically Signed   By: Bary Richard M.D.   On: 04/15/2017 15:24   Ct Chest W Contrast  Result Date: 04/15/2017 CLINICAL DATA:  Status post fall last night x2. Altered mental status. EXAM: CT CHEST, ABDOMEN, AND PELVIS WITH CONTRAST TECHNIQUE: Multidetector  CT imaging of the chest, abdomen and pelvis was performed following the standard protocol during bolus administration of intravenous contrast. CONTRAST:  100 ml ISOVUE-300 IOPAMIDOL (ISOVUE-300) INJECTION 61% COMPARISON:  Single-view of the chest earlier today. FINDINGS: CT CHEST FINDINGS Cardiovascular: Heart size is normal. No pericardial effusion. A few scattered aortic atherosclerotic calcifications are identified. Right PICC is noted with its tip in the  lower superior vena cava. Mediastinum/Nodes: No enlarged mediastinal, hilar or axillary lymph nodes. Thyroid gland, trachea, and esophagus demonstrate no significant findings. Lungs/Pleura: No pleural effusion. Patchy airspace disease is seen in the upper lobes bilaterally. There is dense right lower lobe airspace disease with air bronchograms. Micronodularity is seen and most conspicuous in the right upper lobe, right middle lobe and lingula. Scattered peribronchial thickening is also identified. Musculoskeletal: No acute bony abnormality is identified. 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 No lytic or sclerotic lesion. CT ABDOMEN PELVIS FINDINGS Hepatobiliary: The patient is status post cholecystectomy. Mild intrahepatic biliary ductal dilatation is likely related to gallbladder removal. No focal liver lesion. Pancreas: The pancreas demonstrates some atrophy. No focal lesion or pancreatic duct dilatation. Spleen: Normal in size without focal abnormality. Adrenals/Urinary Tract: Adrenal glands are unremarkable. Kidneys are normal, without renal calculi, focal lesion, or hydronephrosis. Bladder is unremarkable. Stomach/Bowel: Feeding tube is in place with the tip in the stomach. The patient is status post gastric bypass without evidence complication. The colon appears normal. No evidence of appendicitis. Vascular/Lymphatic: Scattered atherosclerosis without aneurysm. No lymphadenopathy. Reproductive:  Prostate is unremarkable. Other: No fluid collection. Metallic densities projecting in the right lower quadrant could be due to prior surgery or trauma. Musculoskeletal: No acute or focal bony abnormality. IMPRESSION: Extensive bilateral airspace disease worst in the right lower lobe consistent with pneumonia. Extensive micro nodularity bilaterally raise the possibility of atypical infection. No other acute abnormality chest, abdomen or pelvis. Atherosclerosis. Status post cholecystectomy and gastric bypass. Electronically Signed   By: Drusilla Kanner M.D.   On: 04/15/2017 15:31   Ct Abdomen Pelvis W Contrast  Result Date: 04/15/2017 CLINICAL DATA:  Status post fall last night x2. Altered mental status. EXAM: CT CHEST, ABDOMEN, AND PELVIS WITH CONTRAST TECHNIQUE: Multidetector CT imaging of the chest, abdomen and pelvis was performed following the standard protocol during bolus administration of intravenous contrast. CONTRAST:  100 ml ISOVUE-300 IOPAMIDOL (ISOVUE-300) INJECTION 61% COMPARISON:  Single-view of the chest earlier today. FINDINGS: CT CHEST FINDINGS Cardiovascular: Heart size is normal. No pericardial effusion. A few scattered aortic atherosclerotic calcifications are identified. Right PICC is noted with its tip in the lower superior vena cava. Mediastinum/Nodes: No enlarged mediastinal, hilar or axillary lymph nodes. Thyroid gland, trachea, and esophagus demonstrate no significant findings. Lungs/Pleura: No pleural effusion. Patchy airspace disease is seen in the upper lobes bilaterally. There is dense right lower lobe airspace disease with air bronchograms. Micronodularity is seen and most conspicuous in the right upper lobe, right middle lobe and lingula. Scattered peribronchial thickening is also identified. Musculoskeletal: No acute bony abnormality is identified. 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 No lytic or sclerotic lesion. CT  ABDOMEN PELVIS FINDINGS Hepatobiliary: The patient is status post cholecystectomy. Mild intrahepatic biliary ductal dilatation is likely related to gallbladder removal. No focal liver lesion. Pancreas: The pancreas demonstrates some atrophy. No focal lesion or pancreatic duct dilatation. Spleen: Normal in size without focal abnormality. Adrenals/Urinary Tract: Adrenal glands are unremarkable. Kidneys are normal, without renal calculi, focal lesion, or hydronephrosis. Bladder is unremarkable. Stomach/Bowel: Feeding tube is in place with the tip in the stomach. The patient is status post gastric bypass without evidence complication. The colon appears normal. No evidence of appendicitis. Vascular/Lymphatic: Scattered atherosclerosis without aneurysm. No lymphadenopathy. Reproductive: Prostate is unremarkable. Other: No fluid collection. Metallic densities projecting in the right lower quadrant could be due  to prior surgery or trauma. Musculoskeletal: No acute or focal bony abnormality. IMPRESSION: Extensive bilateral airspace disease worst in the right lower lobe consistent with pneumonia. Extensive micro nodularity bilaterally raise the possibility of atypical infection. No other acute abnormality chest, abdomen or pelvis. Atherosclerosis. Status post cholecystectomy and gastric bypass. Electronically Signed   By: Drusilla Kanner M.D.   On: 04/15/2017 15:31   Dg Chest Port 1 View  Result Date: 04/15/2017 CLINICAL DATA:  56 year old male with cough fever and weakness today. EXAM: PORTABLE CHEST 1 VIEW COMPARISON:  None. FINDINGS: Portable AP semi upright view at 1230 hours. Right PICC line is in place. There is confluent somewhat masslike opacity at the right lung base with evidence of right lung volume loss. The patient is also mildly rotated to the right. No pneumothorax or pulmonary edema. The left lung is clear allowing for portable technique. Mild if any cardiomegaly. Other mediastinal contours are within  normal limits. Visualized tracheal air column is within normal limits. Epigastric and right upper quadrant surgical clips. Negative visible bowel gas pattern. IMPRESSION: 1. A right PICC line is in place. 2. Abnormal right lung base, but nonspecific. There is evidence of some right lung volume loss. This could reflect a combination of any of the following: Pneumonia, lung mass, pleural effusion, lower lobe collapse. Chest CT (IV contrast preferred) would characterize further. 3. Left lung appears clear. Electronically Signed   By: Odessa Fleming M.D.   On: 04/15/2017 13:02    Procedures Procedures (including critical care time)  Medications Ordered in ED Medications  meropenem (MERREM) 1 g in sodium chloride 0.9 % 100 mL IVPB (0 g Intravenous Stopped 04/15/17 1422)  iopamidol (ISOVUE-300) 61 % injection (not administered)  sodium chloride 0.9 % bolus 1,000 mL (0 mLs Intravenous Stopped 04/15/17 1438)  acetaminophen (TYLENOL) solution 500 mg (500 mg Per Tube Given 04/15/17 1258)  vancomycin (VANCOCIN) IVPB 1000 mg/200 mL premix (0 mg Intravenous Stopped 04/15/17 1451)  diazepam (VALIUM) injection 5 mg (5 mg Intravenous Given 04/15/17 1415)  iopamidol (ISOVUE-300) 61 % injection 100 mL (100 mLs Intravenous Contrast Given 04/15/17 1453)     Initial Impression / Assessment and Plan / ED Course  I have reviewed the triage vital signs and the nursing notes.  Pertinent labs & imaging results that were available during my care of the patient were reviewed by me and considered in my medical decision making (see chart for details).     Sylar Voong is a 56 y.o. male here with AMS, confusion, diarrhea. He has low grade temp 100.1 F in the ED and borderline hypotensive. Concerned that he may be septic from either aspiration pneumonia vs C diff vs UTI. Sacral decub doesn't appear infected. Will do full sepsis workup. Will hydrate and likely admit. I think the agitation and confusion likely metabolic encephalopathy  from underlying sepsis and I don't think he has a primary psychiatric issue.   3:44 PM   Labs showed WBC 16. CXR showed possible aspiration pneumonia. CT chest/ab/pel confirmed aspiration pneumonia and no colitis. Ordered vanc/zosyn. BP improved with low 100s with IVF. Lactate nl. UA nl. Ordered C diff but it is pending still. Will admit for HCAP. Also required 6 L [place] (baseline around 2 L).   Final Clinical Impressions(s) / ED Diagnoses   Final diagnoses:  None    New Prescriptions New Prescriptions   No medications on file     Charlynne Pander, MD 04/15/17 1546

## 2017-04-16 DIAGNOSIS — Z931 Gastrostomy status: Secondary | ICD-10-CM

## 2017-04-16 DIAGNOSIS — E119 Type 2 diabetes mellitus without complications: Secondary | ICD-10-CM

## 2017-04-16 DIAGNOSIS — J9611 Chronic respiratory failure with hypoxia: Secondary | ICD-10-CM

## 2017-04-16 DIAGNOSIS — Z930 Tracheostomy status: Secondary | ICD-10-CM

## 2017-04-16 DIAGNOSIS — E43 Unspecified severe protein-calorie malnutrition: Secondary | ICD-10-CM | POA: Insufficient documentation

## 2017-04-16 LAB — EXPECTORATED SPUTUM ASSESSMENT W GRAM STAIN, RFLX TO RESP C

## 2017-04-16 LAB — GLUCOSE, CAPILLARY
GLUCOSE-CAPILLARY: 128 mg/dL — AB (ref 65–99)
GLUCOSE-CAPILLARY: 72 mg/dL (ref 65–99)
Glucose-Capillary: 110 mg/dL — ABNORMAL HIGH (ref 65–99)
Glucose-Capillary: 191 mg/dL — ABNORMAL HIGH (ref 65–99)

## 2017-04-16 LAB — MRSA PCR SCREENING: MRSA BY PCR: NEGATIVE

## 2017-04-16 LAB — IRON AND TIBC
Iron: 19 ug/dL — ABNORMAL LOW (ref 45–182)
Saturation Ratios: 9 % — ABNORMAL LOW (ref 17.9–39.5)
TIBC: 207 ug/dL — ABNORMAL LOW (ref 250–450)
UIBC: 188 ug/dL

## 2017-04-16 LAB — COMPREHENSIVE METABOLIC PANEL
ALBUMIN: 3.3 g/dL — AB (ref 3.5–5.0)
ALK PHOS: 58 U/L (ref 38–126)
ALT: 13 U/L — AB (ref 17–63)
AST: 18 U/L (ref 15–41)
Anion gap: 9 (ref 5–15)
BUN: 19 mg/dL (ref 6–20)
CALCIUM: 8.9 mg/dL (ref 8.9–10.3)
CHLORIDE: 104 mmol/L (ref 101–111)
CO2: 28 mmol/L (ref 22–32)
CREATININE: 0.4 mg/dL — AB (ref 0.61–1.24)
GFR calc non Af Amer: >60 mL/min (ref >60)
GLUCOSE: 129 mg/dL — AB (ref 65–99)
Potassium: 3.7 mmol/L (ref 3.5–5.1)
SODIUM: 141 mmol/L (ref 135–145)
Total Bilirubin: 0.6 mg/dL (ref 0.3–1.2)
Total Protein: 7.5 g/dL (ref 6.5–8.1)

## 2017-04-16 LAB — CBC
HCT: 26.5 % — ABNORMAL LOW (ref 39.0–52.0)
Hemoglobin: 8.4 g/dL — ABNORMAL LOW (ref 13.0–17.0)
MCH: 27.5 pg (ref 26.0–34.0)
MCHC: 31.7 g/dL (ref 30.0–36.0)
MCV: 86.9 fL (ref 78.0–100.0)
Platelets: 321 10*3/uL (ref 150–400)
RBC: 3.05 MIL/uL — ABNORMAL LOW (ref 4.22–5.81)
RDW: 16.1 % — ABNORMAL HIGH (ref 11.5–15.5)
WBC: 10.9 10*3/uL — ABNORMAL HIGH (ref 4.0–10.5)

## 2017-04-16 LAB — HIV ANTIBODY (ROUTINE TESTING W REFLEX): HIV SCREEN 4TH GENERATION: NONREACTIVE

## 2017-04-16 LAB — RETICULOCYTES
RBC.: 3.05 MIL/uL — AB (ref 4.22–5.81)
RETIC CT PCT: 2.3 % (ref 0.4–3.1)
Retic Count, Absolute: 70.2 10*3/uL (ref 19.0–186.0)

## 2017-04-16 LAB — EXPECTORATED SPUTUM ASSESSMENT W REFEX TO RESP CULTURE

## 2017-04-16 LAB — FERRITIN: Ferritin: 145 ng/mL (ref 24–336)

## 2017-04-16 LAB — VITAMIN B12: VITAMIN B 12: 461 pg/mL (ref 180–914)

## 2017-04-16 LAB — FOLATE: Folate: 27.1 ng/mL (ref >5.9)

## 2017-04-16 MED ORDER — JEVITY 1.2 CAL PO LIQD
1000.0000 mL | ORAL | Status: DC
  Administered 2017-04-16: 1000 mL
  Filled 2017-04-16 (×2): qty 1000

## 2017-04-16 MED ORDER — AZTREONAM IN DEXTROSE 2 GM/50ML IV SOLN
2.0000 g | Freq: Three times a day (TID) | INTRAVENOUS | Status: DC
  Filled 2017-04-16: qty 50

## 2017-04-16 MED ORDER — SODIUM CHLORIDE 0.9% FLUSH
10.0000 mL | INTRAVENOUS | Status: DC | PRN
  Administered 2017-04-16: 10 mL
  Administered 2017-04-17: 20 mL
  Filled 2017-04-16 (×2): qty 40

## 2017-04-16 MED ORDER — FREE WATER
150.0000 mL | Freq: Four times a day (QID) | Status: DC
  Administered 2017-04-16: 150 mL

## 2017-04-16 MED ORDER — PIPERACILLIN-TAZOBACTAM 3.375 G IVPB
3.3750 g | Freq: Three times a day (TID) | INTRAVENOUS | Status: DC
  Administered 2017-04-16 – 2017-04-20 (×12): 3.375 g via INTRAVENOUS
  Filled 2017-04-16 (×14): qty 50

## 2017-04-16 MED ORDER — FREE WATER
200.0000 mL | Freq: Four times a day (QID) | Status: DC
  Administered 2017-04-16 – 2017-04-20 (×13): 200 mL

## 2017-04-16 NOTE — Progress Notes (Signed)
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 No fever  Assessment/Plan: Principal Problem:   Aspiration pneumonia (HCC) Active Problems:   Bipolar affective disorder, manic, mild (HCC)   Tracheostomy present (HCC)   PEG (percutaneous endoscopic gastrostomy) status (HCC)   Chronic respiratory failure with hypoxia (HCC)   Normocytic anemia   Diabetes mellitus type 2 in nonobese (HCC)   Acute on chronic hypoxic respiratory failure likely due to aspiration pneumonia:  -per daughter patient has h/o vocal cord paralysis  --H/o VDRF s/p trach, trach removed two days ago at [facility], base line on 2liter per patient --He required 6 literoxygen supplement initially to keep o2 sats at 90% --He gets all feedings through his PEG tube. He is supposedly to be strict NPO, but per nursing staff at [facility], he still drinks off the faucet and drink any thing that he can get , even drinks off from the saline bottle --he is started on vancomycin and aztreonam since admission. vanc discontinued (mrsa screening negative, and this is likely aspiration penumonia), aztreonam changed to zosyn, blood culture no growth so far, sputum culture pending recollection  Poor urine output: likely from dehydration, increase ivf, increase free water flush  Bacteriuria:  urine culture 20.000 colonies of ecoli, patient denies urinary symptom  Dysphagia: Secondary to tracheostomy. Speech therapy to see. He is on tube feedings. dietitian input apprecaited. Tube feedings with free water flush initiated. Check residuals. Continue Reglan  History of C. difficile:  No history of diarrhea recently. Looks like he was treated for C. difficile while he was at [facility]. Continue Probiotics.  Full thickness, Stage  3 tissue loss/pressure injury  to two areas; left buttock and left ischial tuberosity, presented on admission Wound type:Pressure plus moisture, shear Wound care nurse will be consulted, input appreciated  History of DVT: Continue Xarelto.   Normocytic anemia: Baseline hemoglobin is not known. No evidence for overt bleeding. Continue to monitor hemoglobin. Check anemia panel   Diabetes mellitus type 2:  he report he has chronic peripheral neuropathy from diabetes.  He is not noted to be on any long-term medications.  SSI. Continue neurontin HbA1c  Chronic pain: Noted to be on a fentanyl patch, which will be continued for now.  History of bipolar disorder with recent bizarre behavior:  CT head did not show any acute findings. Continue Seroquel, valproic acid which was recently started from [facility]  He is also noted to be on gabapentin. Valproic acid level subtherapeutic  DVT Prophylaxis: On anticoagulation  Code Status: Full code  Family Communication: No family at bedside  Disposition Plan : SNF   Consultants:  Speech/PT/OT/respiratory therapy  Procedures:  none  Antibiotics:  vanc /azteronam from admission to 6/14  Zosyn from 6/14   Objective: BP 105/72 (BP Location: Right Arm)   Pulse (!) 110   Temp 98.2 F (36.8 C) (Oral)   Resp 18   Wt 79.8 kg (176 lb)   SpO2 98%  No intake or output data in the 24 hours ending 04/16/17 0829 Filed Weights   04/15/17 1156  Weight: 79.8 kg (176 lb)    Exam:   General:  Frail, chronically ill appearing, NAD, tracheostomy with dressing in place  Cardiovascular: RRR  Respiratory: crackles at right basis, some rhonchi, no wheezing, respiratory effort wnl  Abdomen:  Peg tube in place, Soft/ND/NT, positive BS  Musculoskeletal: No Edema  Neuro: aaox3 Skin: Stage II decubiti noted in the buttock and sacral area  Data Reviewed: Basic Metabolic Panel:  Recent Labs Lab 04/15/17 1159  NA 139  K 4.4  CL 101    CO2 29  GLUCOSE 189*  BUN 28*  CREATININE 0.53*  CALCIUM 9.1   Liver Function Tests:  Recent Labs Lab 04/15/17 1159  AST 17  ALT 16*  ALKPHOS 59  BILITOT 0.4  PROT 7.3  ALBUMIN 3.1*   No results for input(s): LIPASE, AMYLASE in the last 168 hours. No results for input(s): AMMONIA in the last 168 hours. CBC:  Recent Labs Lab 04/15/17 1159  WBC 16.7*  NEUTROABS 14.3*  HGB 9.8*  HCT 31.5*  MCV 85.8  PLT 297   Cardiac Enzymes:   No results for input(s): CKTOTAL, CKMB, CKMBINDEX, TROPONINI in the last 168 hours. BNP (last 3 results) No results for input(s): BNP in the last 8760 hours.  ProBNP (last 3 results) No results for input(s): PROBNP in the last 8760 hours.  CBG:  Recent Labs Lab 04/15/17 2051 04/16/17 0734  GLUCAP 85 128*    Recent Results (from the past 240 hour(s))  Urine culture     Status: Abnormal (Preliminary result)   Collection Time: 04/15/17 11:34 AM  Result Value Ref Range Status   Specimen Description URINE, CLEAN CATCH  Final   Special Requests NONE  Final   Culture 20,000 COLONIES/mL GRAM NEGATIVE RODS (A)  Final   Report Status PENDING  Incomplete     Studies: Ct Head Wo Contrast  Result Date: 04/15/2017 CLINICAL DATA:  Aggressive behavior. Patient found on floor today. Hx of DKA ^186mL ISOVUE-300 IOPAMIDOL (ISOVUE-300) INJECTION 61% EXAM: CT HEAD WITHOUT CONTRAST TECHNIQUE: Contiguous axial images were obtained from the base of the skull through the vertex without intravenous contrast. COMPARISON:  None. FINDINGS: Brain: There is mild generalized parenchymal atrophy with commensurate dilatation of the ventricles and sulci. Mild chronic small vessel ischemic changes noted within the deep periventricular white matter. There is no mass, hemorrhage, edema or other evidence of acute parenchymal abnormality. No extra-axial hemorrhage. Vascular: There are chronic calcified atherosclerotic changes of the large vessels at the skull base. No  unexpected hyperdense vessel. Skull: Normal. Negative for fracture or focal lesion. Sinuses/Orbits: Mucosal thickening within the sphenoid sinus and right maxillary sinus, of uncertain chronicity. Chronic appearing mastoid fluid bilaterally. Periorbital and retro-orbital soft tissues are unremarkable. Other: None. IMPRESSION: 1. No acute intracranial abnormality. No intracranial mass, hemorrhage or edema. 2. Mild paranasal sinus disease, of uncertain chronicity, most likely chronic. Electronically Signed   By: Bary Richard M.D.   On: 04/15/2017 15:24   Ct Chest W Contrast  Result Date: 04/15/2017 CLINICAL DATA:  Status post fall last night x2. Altered mental status. EXAM: CT CHEST, ABDOMEN, AND PELVIS WITH CONTRAST TECHNIQUE: Multidetector CT imaging of the chest, abdomen and pelvis was performed following the standard protocol during bolus administration of intravenous contrast. CONTRAST:  100 ml ISOVUE-300 IOPAMIDOL (ISOVUE-300) INJECTION 61% COMPARISON:  Single-view of the chest earlier today. FINDINGS: CT CHEST FINDINGS Cardiovascular: Heart size is normal. No pericardial effusion. A few scattered aortic atherosclerotic calcifications are identified. Right PICC is noted with its tip in the lower superior vena cava. Mediastinum/Nodes: No enlarged mediastinal, hilar or axillary lymph nodes. Thyroid gland, trachea, and esophagus demonstrate no significant findings. Lungs/Pleura: No pleural effusion. Patchy airspace disease is seen in the upper lobes bilaterally.  There is dense right lower lobe airspace disease with air bronchograms. Micronodularity is seen and most conspicuous in the right upper lobe, right middle lobe and lingula. Scattered peribronchial thickening is also identified. Musculoskeletal: No acute bony abnormality is identified. 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 No lytic or sclerotic lesion. CT ABDOMEN PELVIS FINDINGS Hepatobiliary:  The patient is status post cholecystectomy. Mild intrahepatic biliary ductal dilatation is likely related to gallbladder removal. No focal liver lesion. Pancreas: The pancreas demonstrates some atrophy. No focal lesion or pancreatic duct dilatation. Spleen: Normal in size without focal abnormality. Adrenals/Urinary Tract: Adrenal glands are unremarkable. Kidneys are normal, without renal calculi, focal lesion, or hydronephrosis. Bladder is unremarkable. Stomach/Bowel: Feeding tube is in place with the tip in the stomach. The patient is status post gastric bypass without evidence complication. The colon appears normal. No evidence of appendicitis. Vascular/Lymphatic: Scattered atherosclerosis without aneurysm. No lymphadenopathy. Reproductive: Prostate is unremarkable. Other: No fluid collection. Metallic densities projecting in the right lower quadrant could be due to prior surgery or trauma. Musculoskeletal: No acute or focal bony abnormality. IMPRESSION: Extensive bilateral airspace disease worst in the right lower lobe consistent with pneumonia. Extensive micro nodularity bilaterally raise the possibility of atypical infection. No other acute abnormality chest, abdomen or pelvis. Atherosclerosis. Status post cholecystectomy and gastric bypass. Electronically Signed   By: Drusilla Kannerhomas  Dalessio M.D.   On: 04/15/2017 15:31   Ct Abdomen Pelvis W Contrast  Result Date: 04/15/2017 CLINICAL DATA:  Status post fall last night x2. Altered mental status. EXAM: CT CHEST, ABDOMEN, AND PELVIS WITH CONTRAST TECHNIQUE: Multidetector CT imaging of the chest, abdomen and pelvis was performed following the standard protocol during bolus administration of intravenous contrast. CONTRAST:  100 ml ISOVUE-300 IOPAMIDOL (ISOVUE-300) INJECTION 61% COMPARISON:  Single-view of the chest earlier today. FINDINGS: CT CHEST FINDINGS Cardiovascular: Heart size is normal. No pericardial effusion. A few scattered aortic atherosclerotic  calcifications are identified. Right PICC is noted with its tip in the lower superior vena cava. Mediastinum/Nodes: No enlarged mediastinal, hilar or axillary lymph nodes. Thyroid gland, trachea, and esophagus demonstrate no significant findings. Lungs/Pleura: No pleural effusion. Patchy airspace disease is seen in the upper lobes bilaterally. There is dense right lower lobe airspace disease with air bronchograms. Micronodularity is seen and most conspicuous in the right upper lobe, right middle lobe and lingula. Scattered peribronchial thickening is also identified. Musculoskeletal: No acute bony abnormality is identified. 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 No lytic or sclerotic lesion. CT ABDOMEN PELVIS FINDINGS Hepatobiliary: The patient is status post cholecystectomy. Mild intrahepatic biliary ductal dilatation is likely related to gallbladder removal. No focal liver lesion. Pancreas: The pancreas demonstrates some atrophy. No focal lesion or pancreatic duct dilatation. Spleen: Normal in size without focal abnormality. Adrenals/Urinary Tract: Adrenal glands are unremarkable. Kidneys are normal, without renal calculi, focal lesion, or hydronephrosis. Bladder is unremarkable. Stomach/Bowel: Feeding tube is in place with the tip in the stomach. The patient is status post gastric bypass without evidence complication. The colon appears normal. No evidence of appendicitis. Vascular/Lymphatic: Scattered atherosclerosis without aneurysm. No lymphadenopathy. Reproductive: Prostate is unremarkable. Other: No fluid collection. Metallic densities projecting in the right lower quadrant could be due to prior surgery or trauma. Musculoskeletal: No acute or focal bony abnormality. IMPRESSION: Extensive bilateral airspace disease worst in the right lower lobe consistent with pneumonia. Extensive micro nodularity bilaterally raise the possibility of atypical  infection. No other acute abnormality chest, abdomen or pelvis. Atherosclerosis. Status post cholecystectomy and gastric bypass. Electronically Signed   By: Drusilla Kanner M.D.   On: 04/15/2017 15:31   Dg Chest Port 1 View  Result Date: 04/15/2017 CLINICAL DATA:  56 year old male with cough fever and weakness today. EXAM: PORTABLE CHEST 1 VIEW COMPARISON:  None. FINDINGS: Portable AP semi upright view at 1230 hours. Right PICC line is in place. There is confluent somewhat masslike opacity at the right lung base with evidence of right lung volume loss. The patient is also mildly rotated to the right. No pneumothorax or pulmonary edema. The left lung is clear allowing for portable technique. Mild if any cardiomegaly. Other mediastinal contours are within normal limits. Visualized tracheal air column is within normal limits. Epigastric and right upper quadrant surgical clips. Negative visible bowel gas pattern. IMPRESSION: 1. A right PICC line is in place. 2. Abnormal right lung base, but nonspecific. There is evidence of some right lung volume loss. This could reflect a combination of any of the following: Pneumonia, lung mass, pleural effusion, lower lobe collapse. Chest CT (IV contrast preferred) would characterize further. 3. Left lung appears clear. Electronically Signed   By: Odessa Fleming M.D.   On: 04/15/2017 13:02    Scheduled Meds: . clonazePAM  0.5 mg Per Tube Q8H  . famotidine  20 mg Per Tube Q12H  . fentaNYL  12.5 mcg Transdermal Q72H  . gabapentin  300 mg Per Tube Q8H  . insulin aspart  0-15 Units Subcutaneous TID WC  . ipratropium-albuterol  3 mL Nebulization Q6H  . metoCLOPramide  5 mg Per Tube Q8H  . multivitamin with minerals  1 tablet Oral Q breakfast  . pantoprazole sodium  40 mg Per Tube QAC breakfast  . QUEtiapine  50 mg Per Tube Q12H  . rivaroxaban  20 mg Oral Q breakfast  . saccharomyces boulardii  250 mg Per Tube BID  . sertraline  100 mg Per Tube QAC breakfast  . Valproate  Sodium  500 mg Per Tube Q12H    Continuous Infusions: . sodium chloride    . aztreonam    . feeding supplement (JEVITY 1.2 CAL) 1,000 mL (04/16/17 0003)  . vancomycin 1,000 mg (04/16/17 1610)     Time spent:  Aroldo Galli MD, PhD  [facility] Pager [phone]. If 7PM-7AM, please contact night-coverage at [email or web address], password [facility] 04/16/2017, 8:29 AM  LOS: 1 day

## 2017-04-16 NOTE — Evaluation (Signed)
Physical Therapy Evaluation Patient Details Name: Cory Montoya MRN: [id number] DOB: 06-05-1961 Today's Date: 04/16/2017   History of Present Illness  56 y.o. male with a past medical history of chronic respiratory failure, tracheostomy, PEG tube, DVT, bipolar d/o,  diabetes, bipolar disorder who was at the [facility] ([facility] had been [facility]) where he was recently extubated and who was admitted here due to bizarre behavior over the last few days. Dx of PNA.   Clinical Impression  Pt admitted with above diagnosis. Pt currently with functional limitations due to the deficits listed below (see PT Problem List). Pt ambulated 380' with RW, SaO2 90% on RA walking, HR 129 max, no loss of balance. From PT standpoint he can DC home. Per RN, pt stated his daughter can assist him at home. Home situation and prior level of function difficult to obtain from pt due to expressive difficulties 2* tracheostomy. He was pleasant, very eager to mobilize, and grateful to ambulate.  No inappropriate/bizarre behavior noted during PT eval.  Pt will benefit from skilled PT to increase their independence and safety with mobility to allow discharge to the venue listed below.       Follow Up Recommendations Supervision for mobility/OOB;Home health PT    Equipment Recommendations  Rolling walker with 5" wheels    Recommendations for Other Services       Precautions / Restrictions Precautions Precautions: Fall Precaution Comments: monitor O2 Restrictions Weight Bearing Restrictions: No      Mobility  Bed Mobility               General bed mobility comments: up in recliner  Transfers Overall transfer level: Needs assistance Equipment used: Rolling walker (2 wheeled) Transfers: Sit to/from Stand Sit to Stand: Mod assist         General transfer comment: mod A to rise from recliner, VCs hand placement  Ambulation/Gait Ambulation/Gait assistance: Min guard Ambulation  Distance (Feet): 380 Feet Assistive device: Rolling walker (2 wheeled) Gait Pattern/deviations: Step-through pattern;Decreased stride length   Gait velocity interpretation: Below normal speed for age/gender General Gait Details: steady with RW with no LOB, HR 129 max, SaO2 90-91% on RA, 98% on 6L O2 [place] (pt was on 5L on wall O2 in room at start of PT eval)  Stairs            Wheelchair Mobility    Modified Rankin (Stroke Patients Only)       Balance Overall balance assessment: Needs assistance   Sitting balance-Leahy Scale: Good       Standing balance-Leahy Scale: Fair                               Pertinent Vitals/Pain Pain Assessment: No/denies pain    Home Living Family/patient expects to be discharged to:: Unsure                      Prior Function Level of Independence: Needs assistance   Gait / Transfers Assistance Needed: walked with RW and O2 at [facility]  ADL's / Homemaking Assistance Needed: assist needed        Hand Dominance        Extremity/Trunk Assessment   Upper Extremity Assessment Upper Extremity Assessment: Defer to OT evaluation    Lower Extremity Assessment Lower Extremity Assessment: Overall WFL for tasks assessed    Cervical / Trunk Assessment Cervical / Trunk Assessment: Normal  Communication   Communication: Expressive difficulties (whispers, difficult to understand at times 2* tracheostomy)  Cognition Arousal/Alertness: Awake/alert Behavior During Therapy: WFL for tasks assessed/performed Overall Cognitive Status: Difficult to assess                                 General Comments: can follow commands, difficult to understand his speech 2* tracheostomy but [facility] to [facility], pt very grateful to ambulate and repeatedly said "thank you"      General Comments      Exercises     Assessment/Plan    PT Assessment Patient needs continued PT services  PT Problem List Decreased  mobility;Cardiopulmonary status limiting activity;Decreased balance;Decreased activity tolerance       PT Treatment Interventions Gait training;Therapeutic activities;Therapeutic exercise;Functional mobility training;Balance training;Patient/family education    PT Goals (Current goals can be found in the Care Plan section)  Acute Rehab PT Goals Patient Stated Goal: to not have to use O2 PT Goal Formulation: With patient Time For Goal Achievement: 04/30/17 Potential to Achieve Goals: Good    Frequency Min 3X/week   Barriers to discharge        Co-evaluation               AM-PAC PT "6 Clicks" Daily Activity  Outcome Measure Difficulty turning over in bed (including adjusting bedclothes, sheets and blankets)?: A Little Difficulty moving from lying on back to sitting on the side of the bed? : A Lot Difficulty sitting down on and standing up from a chair with arms (e.g., wheelchair, bedside commode, etc,.)?: A Lot Help needed moving to and from a bed to chair (including a wheelchair)?: A Little Help needed walking in hospital room?: A Little Help needed climbing 3-5 steps with a railing? : A Lot 6 Click Score: 15    End of Session Equipment Utilized During Treatment: Gait belt;Oxygen Activity Tolerance: Patient tolerated treatment well;No increased pain Patient left: in chair;with call bell/phone within reach Nurse Communication: Mobility status PT Visit Diagnosis: Other abnormalities of gait and mobility (R26.89)    Time: [id number] PT Time Calculation (min) (ACUTE ONLY): 39 min   Charges:   PT Evaluation $PT Eval Low Complexity: 1 Procedure PT Treatments $Gait Training: 23-37 mins   PT G Codes:          Tamala SerUhlenberg, Banks Chaikin Kistler 04/16/2017, 11:46 AM [phone]

## 2017-04-16 NOTE — Care Management Note (Signed)
Case Management Note  Patient Details  Name: Cory Montoya MRN: [id number] Date of Birth: 07/22/1961  Subjective/Objective:                  56 y.o. male with a past medical history of chronic respiratory failure, tracheostomy, PEG tube, DVT, diabetes, bipolar disorder who was at the [facility] and who was sent over here due to bizarre behavior over the last few days. Patient has apparently been more combative and was making sexual comments to the staff over in that facility. Patient's dose of Seroquel was increased. He was also placed on valproic acid. Apparently, patient was on IV antibiotics for pneumonia and finished a course 2-3 weeks ago. He still has a PICC line. He also has a history of C. difficile, which has been treated previously. Most of this information is obtained from nursing staff as well as notes. No family is at bedside. Patient unable to provide much of a history. He appears to be quite emotionally labile. Starts crying. It appears that patient was admitted to [facility] from another facility where he [facility] a [facility] in [facility]. He had to be intubated. Could not be weaned off and had to have a tracheostomy tube placed. He also has a PEG tube. At the [facility] facility he was weaned off of his ventilator. He is currently on trach collar. Patient complains of pain in his buttock area where he has wounds.  In the emergency department, patient was evaluated. He was noted to be borderline hypotensive and was noted to have a low-grade fever and was mildly tachycardic. CT scan of the chest, abdomen, pelvis was done and it revealed evidence for pneumonia right more than left. CT scan of the head did not show any acute findings. He was noted to have elevated WBC. No diarrhea has been reported  Action/Plan: Date:  April 16, 2017  Chart reviewed for concurrent status and case management needs.  Will continue to follow patient progress.  Discharge Planning: following for  needs  Expected discharge date: [id number]  Marcelle SmilingRhonda Becci Batty, BSN, [place] TerraceRN3, [place]CCM   [phone]   Expected Discharge Date:   (UNKNOWN)               Expected Discharge Plan:  Home/Self Care  In-House Referral:     Discharge planning Services  CM Consult  Post Acute Care Choice:    Choice offered to:     DME Arranged:    DME Agency:     HH Arranged:    HH Agency:     Status of Service:  In process, will continue to follow  If discussed at Long Length of Stay Meetings, dates discussed:    Additional Comments:  Golda AcreDavis, Dhani Dannemiller Lynn, RN 04/16/2017, 9:47 AM

## 2017-04-16 NOTE — Progress Notes (Signed)
SLP Cancellation Note  Patient Details Name: Cory Montoya MRN: [id number] DOB: 10/12/1961   Cancelled treatment:       Reason Eval/Treat Not Completed: Other (comment). Pt with a history of VF paralysis per his daughter, has had ENT consult, multiple FEES. [facility], but awaiting conversation with primary SLP at [facility] to determine if this is warranted. For now, pt should remain strict NPO.   Harlon DittyBonnie Debbrah Sampedro, MA CCC-SLP [phone]  Cory Montoya, Cory Montoya Caroline 04/16/2017, 11:01 AM

## 2017-04-16 NOTE — Progress Notes (Signed)
OT Cancellation Note  Patient Details Name: Cory HarpsRonald Montoya MRN: [id number] DOB: 08/21/1961   Cancelled Treatment:    Reason Eval/Treat Not Completed: Other (comment).  Spoke to RN/SW.  [facility] and plan is for discharge to a SNF.  Will defer OT evaluation to that venue.  Kresta Templeman 04/16/2017, 11:02 AM  Marica OtterMaryellen Denetria Luevanos, OTR/L [phone] 04/16/2017

## 2017-04-16 NOTE — Progress Notes (Addendum)
Per chart and patient, the patient plans to discharge home with his daughter.  CSW called sister to inquire about plan to discharge home, unable to leave voicemail.     Vivi BarrackNicole Seema Blum, Theresia MajorsLCSWA, MSW Clinical Social Worker 5E and Psychiatric Service Line [phone] 04/16/2017  2:53 PM

## 2017-04-16 NOTE — Consult Note (Signed)
WOC Nurse wound consult note Reason for Consult: Full thickness, Stage 3 tissue loss/pressure injury  to two areas; left buttock and left ischial tuberosity Wound type:Pressure plus moisture, shear Pressure Injury POA: Yes Measurement:  Left buttocks:  4cm x 1.5cm x 0.2cm with red, moist wound bed, scant serous exudate. Left ischial tuberosity: 1cm round with 0.1cm depth.  Red, moist center. No drainage Wound bed:See above Drainage (amount, consistency, odor) See above Periwound: intact, dry Dressing procedure/placement/frequency: I will implement a pressure redistribution chair pad for OOB use and provide twice daily care ot the wounds using saline moistened gauze. WOC nursing team will not follow, but will remain available to this patient, the nursing and medical teams.  Please re-consult if needed. Thanks, Ladona MowLaurie Carita Sollars, MSN, RN, GNP, Hans EdenCWOCN, CWON-AP, FAAN  Pager# [phone]

## 2017-04-16 NOTE — Progress Notes (Signed)
Pt stoma clean and covered with bandage.  Pt does not need or request any respiratory interventions at this time.

## 2017-04-16 NOTE — Evaluation (Signed)
Occupational Therapy Evaluation Patient Details Name: Cory HarpsRonald Busenbark MRN: [id number] DOB: 04/10/1961 Today's Date: 04/16/2017    History of Present Illness 56 y.o. male with a past medical history of chronic respiratory failure, tracheostomy, PEG tube, DVT, diabetes, bipolar disorder who [facility] ([facility]) where he was recently extubated and who was admitted here due to bizarre behavior over the last few days. Dx of PNA.    Clinical Impression   Pt was admitted for the above.  He states that he did not have therapy and has gotten weak.  He is very motivated and hopes to discharge home with family and maximize his participation.  He was at LTC facility and staff helped him extensively. See below for goals in acute setting    Follow Up Recommendations  SNF;Supervision/Assistance - 24 hour;Home health OT (vs)    Equipment Recommendations  3 in 1 bedside commode    Recommendations for Other Services       Precautions / Restrictions Precautions Precautions: Fall Precaution Comments: monitor O2 Restrictions Weight Bearing Restrictions: No      Mobility Bed Mobility               General bed mobility comments: up in recliner  Transfers Overall transfer level: Needs assistance Equipment used: Rolling walker (2 wheeled) Transfers: Sit to/from Stand Sit to Stand: Mod assist         General transfer comment: mod A to rise from recliner, VCs hand placement    Balance Overall balance assessment: Needs assistance   Sitting balance-Leahy Scale: Good       Standing balance-Leahy Scale: Poor (reliant upon walker)                             ADL either performed or assessed with clinical judgement   ADL Overall ADL's : Needs assistance/impaired Eating/Feeding: NPO   Grooming: Minimal assistance;Sitting   Upper Body Bathing: Moderate assistance;Sitting   Lower Body Bathing: Maximal assistance;Sit  to/from stand   Upper Body Dressing : Minimal assistance;Sitting   Lower Body Dressing: Maximal assistance;Sit to/from stand                 General ADL Comments: Pt stood for approximately one minute while wound care RN assessed his skin.  Pt is very motivated.       Vision         Perception     Praxis      Pertinent Vitals/Pain Pain Assessment: 0-10 Pain Score: 6  Pain Location: shoulders at end range; stomach; mostly feet (constant) Pain Descriptors / Indicators: Aching Pain Intervention(s): Limited activity within patient's tolerance;Monitored during session;Repositioned     Hand Dominance Right   Extremity/Trunk Assessment Upper Extremity Assessment Upper Extremity Assessment: Generalized weakness;RUE deficits/detail;LUE deficits/detail RUE Deficits / Details: bil UEs limited to approximately 90; pt compensates, recruiting traps when he raises beyond 60 degrees, bil;   L is weaker than $      Cervical / Trunk Assessment Cervical / Trunk Assessment: Normal   Communication Communication Communication: Expressive difficulties (vocal cord involvement; stoma)   Cognition Arousal/Alertness: Awake/alert Behavior During Therapy: WFL for tasks assessed/performed Overall Cognitive Status: Difficult to assess                                 General Comments: follows commands; tended to  rush through exercises--cues given   General Comments       Exercises Exercises: Other exercises Other Exercises Other Exercises: performed 2 sets of 10 AAROM for FF; cues for speed and to bring arm down for full ROM   Shoulder Instructions      Home Living Family/patient expects to be discharged to:: Unsure                                 Additional Comments: Pt states plan is to go home with sister and have family work out 24/7      Prior Functioning/Environment Level of Independence: Needs assistance  Gait / Transfers Assistance Needed:  walked with RW and O2 at [facility] ADL's / [employer] Assistance Needed: assist needed   Comments: Pt states he had assistance for ADLs; states he had gotten weak and was not receiving therapy        OT Problem List: Decreased strength;Decreased range of motion;Decreased activity tolerance;Impaired balance (sitting and/or standing);Decreased knowledge of use of DME or AE;Pain      OT Treatment/Interventions: Self-care/ADL training;DME and/or AE instruction;Patient/family education;Balance training;Therapeutic exercise;Therapeutic activities    OT Goals(Current goals can be found in the care plan section) Acute Rehab OT Goals Patient Stated Goal: get stronger OT Goal Formulation: With patient Time For Goal Achievement: 04/30/17 Potential to Achieve Goals: Good ADL Goals Pt Will Perform Grooming: with min guard assist;standing Pt Will Transfer to Toilet: with min assist;stand pivot transfer;bedside commode Pt Will Perform Toileting - Clothing Manipulation and hygiene: with min assist;sitting/lateral leans;sit to/from stand Additional ADL Goal #1: pt will perform UB adls with set up, sitting Additional ADL Goal #2: pt will tolerate 2 sets 10 reps AAROM for bil shoulders to increase strength and endurance for adls  OT Frequency: Min 2X/week   Barriers to D/C:            Co-evaluation              AM-PAC PT "6 Clicks" Daily Activity     Outcome Measure Help from another person eating meals?: Total Help from another person taking care of personal grooming?: A Little Help from another person toileting, which includes using toliet, bedpan, or urinal?: A Lot Help from another person bathing (including washing, rinsing, drying)?: A Little Help from another person to put on and taking off regular upper body clothing?: A Little Help from another person to put on and taking off regular lower body clothing?: A Lot 6 Click Score: 14   End of Session    Activity Tolerance: Patient  tolerated treatment well Patient left: in chair;with call bell/phone within reach;with nursing/sitter in room  OT Visit Diagnosis: Unsteadiness on feet (R26.81);Muscle weakness (generalized) (M62.81)                Time: 1610-9604 OT Time Calculation (min): 14 min Charges:  OT General Charges $OT Visit: 1 Procedure OT Evaluation $OT Eval Moderate Complexity: 1 Procedure G-Codes:     Marica Otter, OTR/L [phone] 04/16/2017  Sher Hellinger 04/16/2017, 2:41 PM

## 2017-04-16 NOTE — Progress Notes (Signed)
OT Note:  Initially OT evaluation was going to be deferred to next venue, but PT called to say that pt plans home.  Evaluation performed and filed.  Pt will benefit from OT, and we will follow him in acute setting.  CantrilMaryellen Mychaela Lennartz, North CarolinaOTR/L [phone] 04/16/2017

## 2017-04-16 NOTE — Progress Notes (Signed)
Initial Nutrition Assessment  DOCUMENTATION CODES:   Severe malnutrition in context of chronic illness  INTERVENTION:   Tube feeds via PEG- Jevity 1.2 @ goal rate of 28m/hr. Initiate at 568mhr and increase by 1040mvery 12 hrs until goal rate is reached.   Free water flushes 150m20m hrs via PEG  Regimen provides 2160kcal/day, 100g/day protein, 2053ml8me water  MVI  NUTRITION DIAGNOSIS:   Malnutrition (severe) related to dysphagia (stroke) as evidenced by moderate depletion of body fat, moderate depletions of muscle mass, 24 percent weight loss in 6 months.  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 Pt is a poor historian so most information obtained from chart. [facility] anything by mouth since January 2018. Per chart, it appears that patient initially was being fed via NGT (corpak tube) but obtained a PEG tube in March 2018. Pt is unsure of what [facility] was getting Glucerna at 120ml/33m 24hrs; RD suspects this is not accurate. RD awaiting [facility] back [facility]'s correct tube feed regimen. Pt reports that he has been tolerating tube feeds. Pt reports diarrhea only for the past few days. Per chart, pt has lost 56lbs(24%) in 6 months; this is severe. Pt has non healing pressure wound on his sacrum and a stoma secondary to previous trach site. For now, continue with Jevity 1.2 at this time; may adjust tube feeds after speaking with  [facility].    Medications reviewed and include: pepcid, fentanyl, insulin, reglan, MVI, protonix, florastor, vancomycin  Labs reviewed: creat 0.40(L), alb 3.3(L) Wbc- 10.9(H), Hgb 8.4(L), Hct 26.5(L)  Nutrition-Focused physical exam completed. Findings are moderate fat depletion in orbital regions, chest, and upper arms, moderate to severe muscle depletions over entire body, and mild edema.   Diet Order:  Diet NPO time specified  Skin:  Wound (see comment) (non healing pressure wound sacrum)  Last BM:  6/12  Height:   Ht Readings from Last 1 Encounters:  04/16/17 5' 8"  (1.727 m)    Weight:   Wt Readings from Last 1 Encounters:  04/15/17 176 lb (79.8 kg)    Ideal Body Weight:  70 kg  BMI:  Body mass index is 26.76 kg/m.  Estimated Nutritional Needs:   Kcal:  2100-2400kcal/day   Protein:  96-112g/day   Fluid:  <2L/day   EDUCATION NEEDS:   No education needs identified at this time  Cory Montoya Cory DistanceD, LDN Pager #- [phone]

## 2017-04-17 ENCOUNTER — Inpatient Hospital Stay (HOSPITAL_COMMUNITY): Payer: Medicare Other

## 2017-04-17 DIAGNOSIS — J9621 Acute and chronic respiratory failure with hypoxia: Secondary | ICD-10-CM

## 2017-04-17 DIAGNOSIS — Z659 Problem related to unspecified psychosocial circumstances: Secondary | ICD-10-CM

## 2017-04-17 LAB — CBC WITH DIFFERENTIAL/PLATELET
Basophils Absolute: 0 10*3/uL (ref 0.0–0.1)
Basophils Relative: 0 %
Eosinophils Absolute: 0.4 10*3/uL (ref 0.0–0.7)
Eosinophils Relative: 5 %
HCT: 28.2 % — ABNORMAL LOW (ref 39.0–52.0)
Hemoglobin: 8.8 g/dL — ABNORMAL LOW (ref 13.0–17.0)
LYMPHS ABS: 2.5 10*3/uL (ref 0.7–4.0)
LYMPHS PCT: 29 %
MCH: 26.7 pg (ref 26.0–34.0)
MCHC: 31.2 g/dL (ref 30.0–36.0)
MCV: 85.7 fL (ref 78.0–100.0)
Monocytes Absolute: 0.6 10*3/uL (ref 0.1–1.0)
Monocytes Relative: 8 %
NEUTROS PCT: 58 %
Neutro Abs: 5 10*3/uL (ref 1.7–7.7)
Platelets: 264 10*3/uL (ref 150–400)
RBC: 3.29 MIL/uL — AB (ref 4.22–5.81)
RDW: 16 % — ABNORMAL HIGH (ref 11.5–15.5)
WBC: 8.6 10*3/uL (ref 4.0–10.5)

## 2017-04-17 LAB — BASIC METABOLIC PANEL
Anion gap: 8 (ref 5–15)
BUN: 16 mg/dL (ref 6–20)
CO2: 26 mmol/L (ref 22–32)
Calcium: 8.6 mg/dL — ABNORMAL LOW (ref 8.9–10.3)
Chloride: 104 mmol/L (ref 101–111)
Creatinine, Ser: 0.44 mg/dL — ABNORMAL LOW (ref 0.61–1.24)
GFR calc Af Amer: >60 mL/min (ref >60)
GFR calc non Af Amer: >60 mL/min (ref >60)
GLUCOSE: 116 mg/dL — AB (ref 65–99)
POTASSIUM: 3.5 mmol/L (ref 3.5–5.1)
SODIUM: 138 mmol/L (ref 135–145)

## 2017-04-17 LAB — URINE CULTURE: Culture: 20000 — AB

## 2017-04-17 LAB — GLUCOSE, CAPILLARY
GLUCOSE-CAPILLARY: 120 mg/dL — AB (ref 65–99)
Glucose-Capillary: 120 mg/dL — ABNORMAL HIGH (ref 65–99)
Glucose-Capillary: 119 mg/dL — ABNORMAL HIGH (ref 65–99)

## 2017-04-17 LAB — HEMOGLOBIN A1C
HEMOGLOBIN A1C: 5.6 % (ref 4.8–5.6)
MEAN PLASMA GLUCOSE: 114 mg/dL

## 2017-04-17 MED ORDER — POTASSIUM CHLORIDE 10 MEQ/100ML IV SOLN
10.0000 meq | INTRAVENOUS | Status: AC
  Administered 2017-04-17 (×4): 10 meq via INTRAVENOUS
  Filled 2017-04-17 (×4): qty 100

## 2017-04-17 MED ORDER — JEVITY 1.2 CAL PO LIQD
1000.0000 mL | ORAL | Status: DC
  Administered 2017-04-17 – 2017-04-18 (×2): 1000 mL
  Filled 2017-04-17 (×6): qty 1000

## 2017-04-17 MED ORDER — OXYCODONE-ACETAMINOPHEN 5-325 MG PO TABS
1.0000 | ORAL_TABLET | Freq: Two times a day (BID) | ORAL | Status: DC | PRN
  Administered 2017-04-17 – 2017-04-19 (×4): 1 via ORAL
  Filled 2017-04-17 (×5): qty 1

## 2017-04-17 MED ORDER — OXYCODONE-ACETAMINOPHEN 5-325 MG PO TABS
1.0000 | ORAL_TABLET | Freq: Once | ORAL | Status: AC
  Administered 2017-04-17: 1
  Filled 2017-04-17: qty 1

## 2017-04-17 MED ORDER — IPRATROPIUM-ALBUTEROL 0.5-2.5 (3) MG/3ML IN SOLN
3.0000 mL | Freq: Two times a day (BID) | RESPIRATORY_TRACT | Status: DC
  Administered 2017-04-17 – 2017-04-19 (×5): 3 mL via RESPIRATORY_TRACT
  Filled 2017-04-17 (×5): qty 3

## 2017-04-17 MED ORDER — PROMETHAZINE HCL 6.25 MG/5ML PO SYRP
6.2500 mg | ORAL_SOLUTION | Freq: Once | ORAL | Status: AC
  Administered 2017-04-17: 6.25 mg via ORAL
  Filled 2017-04-17: qty 5

## 2017-04-17 MED ORDER — PROMETHAZINE-PHENYLEPHRINE 6.25-5 MG/5ML PO SYRP: 1.2500 mL | ORAL_SOLUTION | Freq: Once | ORAL | Status: DC

## 2017-04-17 NOTE — Progress Notes (Signed)
Physical Therapy Treatment Patient Details Name: Cory Montoya MRN: [id number] DOB: 05/01/1961 Today's Date: 04/17/2017    History of Present Illness 56 y.o. male with a past medical history of chronic respiratory failure, tracheostomy, PEG tube, DVT, diabetes, bipolar disorder who [facility] at [facility] ([facility]) where he was recently extubated and who was admitted here due to bizarre behavior over the last few days. Dx of PNA.     PT Comments    Assisted with amb in hallway with walker and without.  Performed BERG balance test which pt scored 26/56 indicating HIGH FALL RISK need for AD  Follow Up Recommendations  Supervision for mobility/OOB;Home health PT     Equipment Recommendations  Cane    Recommendations for Other Services       Precautions / Restrictions Precautions Precautions: Fall Precaution Comments: CVA L hemi Restrictions Weight Bearing Restrictions: No    Mobility  Bed Mobility               General bed mobility comments: OOB in recliner  Transfers Overall transfer level: Needs assistance Equipment used: Rolling walker (2 wheeled);None Transfers: Sit to/from Stand Sit to Stand: Mod assist         General transfer comment: mod A to rise from recliner, several attempts       VCs hand placement  Ambulation/Gait Ambulation/Gait assistance: Min assist;Min guard Ambulation Distance (Feet): 145 Feet Assistive device: Rolling walker (2 wheeled);None Gait Pattern/deviations: Step-through pattern;Decreased stride length;Decreased weight shift to left Gait velocity: decreased   General Gait Details: amb without walker for trial noted increased unsteady gait and x 2 lateral LOB with head turn.  Much improved gait with walker however pt stated he probably wont use it but would use a cane.    Stairs            Wheelchair Mobility    Modified Rankin (Stroke Patients Only)       Balance                                  Standardized Balance Assessment Standardized Balance Assessment : Berg Balance Test Berg Balance Test Sit to Stand: Needs moderate or maximal assist to stand Standing Unsupported: Able to stand 2 minutes with supervision Sitting with Back Unsupported but Feet Supported on Floor or Stool: Able to sit safely and securely 2 minutes Stand to Sit: Needs assistance to sit Transfers: Needs one person to assist Standing Unsupported with Eyes Closed: Able to stand 10 seconds with supervision Standing Ubsupported with Feet Together: Able to place feet together independently but unable to hold for 30 seconds From Standing, Reach Forward with Outstretched Arm: Can reach forward >12 cm safely (5") From Standing Position, Pick up Object from Floor: Able to pick up shoe, needs supervision From Standing Position, Turn to Look Behind Over each Shoulder: Looks behind one side only/other side shows less weight shift Turn 360 Degrees: Able to turn 360 degrees safely but slowly Standing Unsupported, Alternately Place Feet on Step/Stool: Needs assistance to keep from falling or unable to try Standing Unsupported, One Foot in Front: Needs help to step but can hold 15 seconds Standing on One Leg: Tries to lift leg/unable to hold 3 seconds but remains standing independently Total Score: 26/56    HIGH FALL RISK      Cognition Arousal/Alertness: Awake/alert Behavior During Therapy: [facility] for tasks assessed/performed  General Comments: difficult to understand s/p trach reversal plus vocal cord paralysis      Exercises      General Comments        Pertinent Vitals/Pain Pain Assessment: No/denies pain    Home Living                      Prior Function            PT Goals (current goals can now be found in the care plan section) Progress towards PT goals: Progressing toward goals    Frequency    Min  3X/week      PT Plan      Co-evaluation              AM-PAC PT "6 Clicks" Daily Activity  Outcome Measure  Difficulty turning over in bed (including adjusting bedclothes, sheets and blankets)?: Total Difficulty moving from lying on back to sitting on the side of the bed? : Total Difficulty sitting down on and standing up from a chair with arms (e.g., wheelchair, bedside commode, etc,.)?: Total Help needed moving to and from a bed to chair (including a wheelchair)?: A Little Help needed walking in hospital room?: A Little Help needed climbing 3-5 steps with a railing? : A Lot 6 Click Score: 11    End of Session Equipment Utilized During Treatment: Gait belt Activity Tolerance: Patient tolerated treatment well;No increased pain Patient left: in chair;with call bell/phone within reach Nurse Communication: Mobility status       Time: 1610-9604 PT Time Calculation (min) (ACUTE ONLY): 15 min  Charges:  $Gait Training: 8-22 mins                    G Codes:       Felecia Shelling  PTA [facility]  Acute  Rehab Pager      [phone]

## 2017-04-17 NOTE — Clinical Social Work Note (Signed)
Clinical Social Work Assessment  Patient Details  Name: Cory Montoya MRN: [id number] Date of Birth: December 16, 1960  Date of referral:  04/17/17               Reason for consult:  Facility Placement?                Permission sought to share information with:  Family Supports Permission granted to share information::     Name::        Agency::     Relationship::  Daughter  Contact Information:     Housing/Transportation Living arrangements for the past 2 months:   [employer] [facility] ) Source of Information:  Patient Patient Interpreter Needed:  None Criminal Activity/Legal Involvement Pertinent to Current Situation/Hospitalization:  No - Comment as needed Significant Relationships:  None Lives with:  Self Do you feel safe going back to the place where you live?  Yes Need for family participation in patient care:  No (Coment)  Care giving concerns: CSW consulted for potential SNF placement vs. Home   Social Worker assessment / plan:  CSW met with patient at bedside explain role, discussed discharge plan. Patient alert and oriented. CSW listened attentively to patient.  Patient explain, "I would like to go home with my daughter." CSW asked if daughter was available, he states she is a caregiver and is hard to reach.  He express happiness about his life and his transition from [facility] back home. Patient was appreciate of CSW services.   Plan: CSW informed RNCM about patient to go home w/ home health.   Employment status:    Insurance information:  Medicare PT Recommendations:  Home with [place] / Referral to community resources:     Patient/Family's Response to care:  Patient reports, daughter is agreeable to take him home at discharge.   Patient/Family's Understanding of and Emotional Response to Diagnosis, Current Treatment, and Prognosis:  No family at bedside.   Emotional Assessment Appearance:  Developmentally appropriate Attitude/Demeanor/Rapport:     Affect (typically observed):  Happy, Accepting Orientation:  Oriented to Self, Oriented to Place, Oriented to  Time Alcohol / Substance use:  Not Applicable Psych involvement (Current and /or in the community):  No (Comment)  Discharge Needs  Concerns to be addressed:  Discharge Planning Concerns Readmission within the last 30 days:  No Current discharge risk:  None Barriers to Discharge:  Continued Medical Work up   [employer], LCSW 04/17/2017, 9:28 AM

## 2017-04-17 NOTE — Progress Notes (Signed)
Stoma clean. Patient's 02 saturations were 85-86% on RA. Breathing treatment given and placed back on 2LNC. RT will continue to monitor.

## 2017-04-17 NOTE — Progress Notes (Signed)
Modified Barium Swallow Progress Note  Patient Details  Name: Cory Montoya MRN: [id number] Date of Birth: 01/25/1961  Today's Date: 04/17/2017  Modified Barium Swallow completed.  Full report located under Chart Review in the Imaging Section.  Brief recommendations include the following:  Clinical Impression  Pt demonstrates a severe oropharygneal and esophageal dysphagia with concern for neuromuscular impairment impacting CN XII and X as well as known vocal fold injury/paralysis. There is lingual deviation to the left, reduced hyolaryngeal elevation, excursion, epiglottic deflection and UES opening as well as appearance of poor negative pharyngeal pressure to pull bolus through pharynx. Liquids boluses spill to the pyriforms and is pushed into the vestibule and falls past the cords as the swallow is initiated, without sensation. Puree is not aspirated, but stasis is severe. A deep chin tuck, with neck fully horizontal (slightly forward leaning) improves bolus transit with minimal residuals with puree and solids. Of note, there is appearance of minimal esophageal persitalsis and after about 2 oz of solids, residuals increase due to esophageal backflow. Recommend pt initaite trials of puree and pudding with a deep chin tuck with full SLP supervision. If this is tolerated, could consider allowing small trials of solids if well masticated under supervision (pt really wants a hamburger). No liquids recommended due to severity of aspiration. Will follow for tolerance.    Swallow Evaluation Recommendations       SLP Diet Recommendations: Dysphagia 1 (Puree) solids           Supervision: Comment (SLP only)           Oral Care Recommendations: Oral care QID       Harlon DittyBonnie Labella Zahradnik, MA CCC-SLP [phone]  Claudine MoutonDeBlois, Khaled Herda Caroline 04/17/2017,2:30 PM

## 2017-04-17 NOTE — Progress Notes (Signed)
CSW spoke with patient daughter Sonya-[phone] She reports, "[facility] I did not receive call. " Patient daughter reports she was scheduled to have a Plan of Care meeting with staff today by phone, something they usually will do and his discharged was not brought up in their last meeting. She has been trying to get in contact with Merchant navy officersocial [facility] and [facility]   Patient daughter reports she feels okay to bring patient home if the physician recommends it. She is worried about the patient trying to eat foods when at home. CSW discussed with physician. She plans to call daughter.    Patient is not agreeable to SNF placement at this time.   Vivi BarrackNicole Lessly Stigler, Theresia MajorsLCSWA, MSW Clinical Social Worker 5E and Psychiatric Service Line [phone] 04/17/2017  3:10 PM

## 2017-04-17 NOTE — Progress Notes (Addendum)
PROGRESS NOTE  Cory Montoya ZOX:[id number] DOB: June 14, 1961 DOA: 04/15/2017 PCP: System, Provider Not In  HPI/Recap of past 24 hours:  Feeling much better, ambulating in room, on 2liter oxygen,  He request pain meds to be doubled   Assessment/Plan: Principal Problem:   Aspiration pneumonia (HCC) Active Problems:   Bipolar affective disorder, manic, mild (HCC)   Tracheostomy present (HCC)   PEG (percutaneous endoscopic gastrostomy) status (HCC)   Chronic respiratory failure with hypoxia (HCC)   Normocytic anemia   Diabetes mellitus type 2 in nonobese (HCC)   Protein-calorie malnutrition, severe   Acute on chronic hypoxic respiratory failure likely due to aspiration pneumonia:  -I have reviewed careeverywhere extensively for past medical history, per care everywhere, patient was found down in prison and sent to local hospital intubated, [facility] was [facility] to [facility] and later has to be trached and pegged and discharged to [facility], he has been at the [facility] for the last 6months,  trach removed two days ago at [facility],  -patient is sent from [facility] to [facility]  Due to behavior issues, He gets all feedings through his PEG tube. He is supposedly to be strict NPO, but per nursing staff at [facility], he still drinks off the faucet and drink any thing that he can get to , even drinks off from the saline bottle -he is found to have acute hypoxia and right lower lobe pneumonia --He required 6 literoxygen supplement initially to keep o2 sats at 90% --he is started on vancomycin and aztreonam since admission. vanc discontinued (mrsa screening negative, and this is likely aspiration penumonia), aztreonam changed to zosyn, blood culture no growth so far, sputum culture pending recollection  Poor urine output: likely from dehydration, increase ivf, increase free water flush Improved   Bacteriuria:  urine culture 20.000 colonies of ecoli, patient denies urinary symptom  Dysphagia: Secondary to  tracheostomy. Speech therapy to see. He is on tube feedings. dietitian input apprecaited. Tube feedings with free water flush initiated. Check residuals. Continue Reglan  Addendum: barium swallow  Concern for "there is appearance of minimal esophageal persitalsis and after about 2 oz of solids, residuals increase due to esophageal backflow" He is on reglan  History of C. difficile:  No history of diarrhea recently. Looks like he was treated for C. difficile while he was at [facility]. Continue Probiotics.  Full thickness, Stage 3 tissue loss/pressure injury  to two areas; left buttock and left ischial tuberosity, presented on admission Wound type:Pressure plus moisture, shear Wound care nurse will be consulted, input appreciated  History of DVT: Continue Xarelto.   Normocytic anemia: Baseline hemoglobin is not known. No evidence for overt bleeding. Continue to monitor hemoglobin. Check anemia panel   Diabetes mellitus type 2:  he report he has chronic peripheral neuropathy from diabetes.  He is not noted to be on any long-term medications.  SSI. Continue neurontin HbA1c  Chronic pain: Noted to be on a fentanyl patch, which will be continued for now. He request fentanyl dose to be doubled, he request prn percocet and ultram, controlled substance data base did not show record of chronic pain meds in the past, I assume this meds was started either while he was hospitalized 6months ago or from the LTAC He is currently on fentanyl dose that he has been on at [facility], will continue current dose,  Prn percocet ordered per dose from controlled substance dase base  Continue prn ultram  He report peg exit site pain: Ct ab "Feeding tube  is in place with the tip in the stomach. The patient is status post gastric bypass without evidence complication. The colon appears normal. No evidence of appendicitis." No erythema or drainage on inspection.  History of bipolar disorder with recent bizarre  behavior:  CT head did not show any acute findings. Continue Seroquel, valproic acid which was recently started from LTAC  He is also noted to be on gabapentin. 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   Difficult social situation:  Daughter report his father has been in and out of their life for a long time, they donot know a lot of about him until he was in the hospital and intubated.  Social worker consulted  DVT Prophylaxis: On anticoagulation  Code Status: Full code  Family Communication: daughter Shelbie Ammonssonya Guaizazo over  The phone at [phone] Disposition Plan : SNF vs home health   Consultants:  Speech/PT/OT/respiratory therapy  Procedures:  none  Antibiotics:  vanc /azteronam from admission to 6/14  Zosyn from 6/14   Objective: BP 118/84 (BP Location: Left Arm)   Pulse (!) 110   Temp 97.7 F (36.5 C) (Oral)   Resp 18   Ht 5\' 8"  (1.727 m)   Wt 83.5 kg (184 lb)   SpO2 90%   BMI 27.98 kg/m   Intake/Output Summary (Last 24 hours) at 04/17/17 1651 Last data filed at 04/17/17 1332  Gross per 24 hour  Intake             1396 ml  Output              450 ml  Net              946 ml   Filed Weights   04/15/17 1156 04/17/17 0833  Weight: 79.8 kg (176 lb) 83.5 kg (184 lb)    Exam:   General:  Frail, chronically ill appearing, NAD, tracheostomy with dressing in place  Cardiovascular: RRR  Respiratory: diminished at right basis, crackles at right basis, some rhonchi, no wheezing, left lung clear, respiratory effort wnl  Abdomen:  Peg tube in place, Soft/ND/NT, positive BS  Musculoskeletal: No Edema  Neuro: aaox3 Skin: Stage II decubiti noted in the buttock and sacral area  Data Reviewed: Basic Metabolic Panel:  Recent Labs Lab 04/15/17 1159 04/16/17 0819 04/17/17 0456  NA 139 141 138  K 4.4 3.7 3.5  CL 101 104 104  CO2 29 28 26   GLUCOSE 189* 129* 116*  BUN 28* 19 16  CREATININE 0.53* 0.40*  0.44*  CALCIUM 9.1 8.9 8.6*   Liver Function Tests:  Recent Labs Lab 04/15/17 1159 04/16/17 0819  AST 17 18  ALT 16* 13*  ALKPHOS 59 58  BILITOT 0.4 0.6  PROT 7.3 7.5  ALBUMIN 3.1* 3.3*   No results for input(s): LIPASE, AMYLASE in the last 168 hours. No results for input(s): AMMONIA in the last 168 hours. CBC:  Recent Labs Lab 04/15/17 1159 04/16/17 0819 04/17/17 0456  WBC 16.7* 10.9* 8.6  NEUTROABS 14.3*  --  5.0  HGB 9.8* 8.4* 8.8*  HCT 31.5* 26.5* 28.2*  MCV 85.8 86.9 85.7  PLT 297 321 264   Cardiac Enzymes:   No results for input(s): CKTOTAL, CKMB, CKMBINDEX, TROPONINI in the last 168 hours. BNP (last 3 results) No results for input(s): BNP in the last 8760 hours.  ProBNP (last 3 results) No results for input(s): PROBNP in the last 8760 hours.  CBG:  Recent  Labs Lab 04/16/17 1704 04/16/17 2131 04/17/17 0735 04/17/17 1131 04/17/17 1638  GLUCAP 191* 72 120* 120* 119*    Recent Results (from the past 240 hour(s))  Urine culture     Status: Abnormal   Collection Time: 04/15/17 11:34 AM  Result Value Ref Range Status   Specimen Description URINE, CLEAN CATCH  Final   Special Requests NONE  Final   Culture 20,000 COLONIES/mL ESCHERICHIA COLI (A)  Final   Report Status 04/17/2017 FINAL  Final   Organism ID, Bacteria ESCHERICHIA COLI (A)  Final      Susceptibility   Escherichia coli - MIC*    AMPICILLIN 16 INTERMEDIATE Intermediate     CEFAZOLIN <=4 SENSITIVE Sensitive     CEFTRIAXONE <=1 SENSITIVE Sensitive     CIPROFLOXACIN <=0.25 SENSITIVE Sensitive     GENTAMICIN <=1 SENSITIVE Sensitive     IMIPENEM <=0.25 SENSITIVE Sensitive     NITROFURANTOIN <=16 SENSITIVE Sensitive     TRIMETH/SULFA <=20 SENSITIVE Sensitive     AMPICILLIN/SULBACTAM 4 SENSITIVE Sensitive     PIP/TAZO <=4 SENSITIVE Sensitive     Extended ESBL NEGATIVE Sensitive     * 20,000 COLONIES/mL ESCHERICHIA COLI  Blood culture (routine x 2)     Status: None (Preliminary result)    Collection Time: 04/15/17 12:40 PM  Result Value Ref Range Status   Specimen Description BLOOD PICC LINE  Final   Special Requests   Final    BOTTLES DRAWN AEROBIC AND ANAEROBIC Blood Culture adequate volume   Culture   Final    NO GROWTH 2 DAYS Performed at [facility], [address], [place], [place] [address]    Report Status PENDING  Incomplete  Blood culture (routine x 2)     Status: None (Preliminary result)   Collection Time: 04/15/17 12:40 PM  Result Value Ref Range Status   Specimen Description BLOOD RIGHT FOREARM  Final   Special Requests   Final    BOTTLES DRAWN AEROBIC AND ANAEROBIC Blood Culture adequate volume   Culture   Final    NO GROWTH 2 DAYS Performed at [facility], [address], [place], [place] [address]    Report Status PENDING  Incomplete  Culture, expectorated sputum-assessment     Status: None   Collection Time: 04/16/17 11:28 AM  Result Value Ref Range Status   Specimen Description EXPECTORATED SPUTUM  Final   Special Requests NONE  Final   Sputum evaluation   Final    Sputum specimen not acceptable for testing.  Please recollect.   Gram Stain Report Called to,Read Back By and Verified With: D LOCK,RN @1238  04/16/17 MKELLY    Report Status 04/16/2017 FINAL  Final  MRSA PCR Screening     Status: None   Collection Time: 04/16/17 11:30 AM  Result Value Ref Range Status   MRSA by PCR NEGATIVE NEGATIVE Final    Comment:        The GeneXpert MRSA Assay (FDA approved for NASAL specimens only), is one component of a comprehensive MRSA colonization surveillance program. It is not intended to diagnose MRSA infection nor to guide or monitor treatment for MRSA infections.      Studies: Dg Swallowing Func-speech Pathology  Result Date: 04/17/2017 Objective Swallowing Evaluation: Type of Study: MBS-Modified Barium Swallow Study Patient Details Name: Cory Montoya MRN: [id number] Date of Birth: 14-Jun-1961 Today's Date: 04/17/2017 Time: SLP  Start Time (ACUTE ONLY): 1240-SLP Stop Time (ACUTE ONLY): 1310 SLP Time Calculation (min) (ACUTE  ONLY): 30 min Past Medical History: Past Medical History: Diagnosis Date . Bipolar disorder (HCC)  . C. difficile diarrhea  . Diabetes mellitus without complication (HCC)  . DKA (diabetic ketoacidoses) (HCC)  . Metabolic encephalopathy  . Pressure ulcer  . Respiratory failure (HCC)  Past Surgical History: No past surgical history on file. HPI: Jlen Penickis a 56 y.o.malewith a past medical history of chronic respiratory failure, tracheostomy, PEG tube, DVT, diabetes, bipolar disorder who was at the [facility] and who was sent over here due to bizarre behavior over the last few days. Pt [facility] to [facility] from another facility [facility] a prison [facility]. He had to be intubated. Could not be weaned off and had to have a tracheostomy tube placed. He also has a PEG tube. At the [facility] facility he was weaned off of his ventilator. He has been decannulated. 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 Has had multiple FEES with no improvement from therapeutic interventions. Has been evaluated by ENT. Reportedly he also drink from the faucet or the saline in the room. Pt noted to have left lingual deviation, he reports a history of stroke, but this is not in his chart. No Data Recorded Assessment / Plan / Recommendation CHL IP CLINICAL IMPRESSIONS 04/17/2017 Clinical Impression Pt demonstrates a severe oropharygneal and esophageal dysphagia with concern for neuromuscular impairment impacting CN XII and X as well as known vocal fold injury/paralysis. There is lingual deviation to the left, reduced hyolaryngeal elevation, excursion, epiglottic deflection and UES opening as well as appearance of poor negative pharyngeal pressure to pull bolus through pharynx. Liquids boluses spill  to the pyriforms and is pushed into the vestibule and falls past the cords as the swallow is initiated, without sensation. Puree is not aspirated, but stasis is severe. A deep chin tuck, with neck fully horizontal (slightly forward leaning) improves bolus transit with minimal residuals with puree and solids. Of note, there is appearance of minimal esophageal persitalsis and after about 2 oz of solids, residuals increase due to esophageal backflow. Recommend pt initaite trials of puree and pudding with a deep chin tuck with full SLP supervision. If this is tolerated, could consider allowing small trials of solids if well masticated under supervision (pt really wants a hamburger). No liquids recommended due to severity of aspiration. Will follow for tolerance.  SLP Visit Diagnosis Dysphagia, oropharyngeal phase (R13.12) Attention and concentration deficit following -- Frontal lobe and executive function deficit following -- Impact on safety and function Severe aspiration risk   CHL IP TREATMENT RECOMMENDATION 04/17/2017 Treatment Recommendations Therapy as outlined in treatment plan below   No flowsheet data found. CHL IP DIET RECOMMENDATION 04/17/2017 SLP Diet Recommendations Dysphagia 1 (Puree) solids Liquid Administration via -- Medication Administration -- Compensations -- Postural Changes --   CHL IP OTHER RECOMMENDATIONS 04/17/2017 Recommended Consults -- Oral Care Recommendations Oral care QID Other Recommendations --   CHL IP FOLLOW UP RECOMMENDATIONS 04/17/2017 Follow up Recommendations Skilled Nursing facility   [facility] IP FREQUENCY AND DURATION 04/17/2017 Speech Therapy Frequency (ACUTE ONLY) min 2x/week Treatment Duration 2 weeks      CHL IP ORAL PHASE 04/17/2017 Oral Phase WFL Oral - Pudding Teaspoon -- Oral - Pudding Cup -- Oral - Honey Teaspoon -- Oral - Honey Cup -- Oral - Nectar Teaspoon -- Oral - Nectar Cup -- Oral - Nectar Straw -- Oral - Thin  Teaspoon -- Oral - Thin Cup -- Oral - Thin Straw -- Oral - Puree  -- Oral - Mech Soft -- Oral - Regular -- Oral - Multi-Consistency -- Oral - Pill -- Oral Phase - Comment --  CHL IP PHARYNGEAL PHASE 04/17/2017 Pharyngeal Phase Impaired Pharyngeal- Pudding Teaspoon -- Pharyngeal -- Pharyngeal- Pudding Cup -- Pharyngeal -- Pharyngeal- Honey Teaspoon Reduced laryngeal elevation;Reduced airway/laryngeal closure;Reduced anterior laryngeal mobility;Reduced epiglottic inversion;Reduced pharyngeal peristalsis;Penetration/Aspiration during swallow;Penetration/Aspiration before swallow;Significant aspiration (Amount);Pharyngeal residue - valleculae;Pharyngeal residue - pyriform;Compensatory strategies attempted (with notebox) Pharyngeal Material enters airway, passes BELOW cords without attempt by patient to eject out (silent aspiration) Pharyngeal- Honey Cup -- Pharyngeal -- Pharyngeal- Nectar Teaspoon Reduced laryngeal elevation;Reduced airway/laryngeal closure;Reduced anterior laryngeal mobility;Reduced epiglottic inversion;Reduced pharyngeal peristalsis;Penetration/Aspiration during swallow;Penetration/Aspiration before swallow;Significant aspiration (Amount);Pharyngeal residue - valleculae;Pharyngeal residue - pyriform;Compensatory strategies attempted (with notebox) Pharyngeal Material enters airway, passes BELOW cords without attempt by patient to eject out (silent aspiration) Pharyngeal- Nectar Cup -- Pharyngeal -- Pharyngeal- Nectar Straw -- Pharyngeal -- Pharyngeal- Thin Teaspoon -- Pharyngeal -- Pharyngeal- Thin Cup -- Pharyngeal -- Pharyngeal- Thin Straw -- Pharyngeal -- Pharyngeal- Puree Reduced pharyngeal peristalsis;Reduced epiglottic inversion;Reduced anterior laryngeal mobility;Reduced laryngeal elevation;Reduced airway/laryngeal closure;Reduced tongue base retraction;Pharyngeal residue - pyriform;Pharyngeal residue - valleculae Pharyngeal -- Pharyngeal- Mechanical Soft -- Pharyngeal -- Pharyngeal- Regular -- Pharyngeal -- Pharyngeal- Multi-consistency -- Pharyngeal --  Pharyngeal- Pill -- Pharyngeal -- Pharyngeal Comment --  CHL IP CERVICAL ESOPHAGEAL PHASE 04/17/2017 Cervical Esophageal Phase Impaired Pudding Teaspoon -- Pudding Cup -- Honey Teaspoon Reduced cricopharyngeal relaxation;Esophageal backflow into cervical esophagus;Esophageal backflow into the pharynx Honey Cup -- Nectar Teaspoon Reduced cricopharyngeal relaxation;Esophageal backflow into cervical esophagus;Esophageal backflow into the pharynx Nectar Cup -- Nectar Straw -- Thin Teaspoon -- Thin Cup -- Thin Straw -- Puree Reduced cricopharyngeal relaxation;Esophageal backflow into cervical esophagus;Esophageal backflow into the pharynx Mechanical Soft -- Regular Reduced cricopharyngeal relaxation;Esophageal backflow into cervical esophagus;Esophageal backflow into the pharynx Multi-consistency -- Pill -- Cervical Esophageal Comment severe esophageal stasis with puree No flowsheet data found. Harlon Ditty, MA CCC-SLP [phone] [place], Riley Nearing 04/17/2017, 2:32 PM               Scheduled Meds: . clonazePAM  0.5 mg Per Tube Q8H  . famotidine  20 mg Per Tube Q12H  . fentaNYL  12.5 mcg Transdermal Q72H  . free water  200 mL Per Tube Q6H  . gabapentin  300 mg Per Tube Q8H  . insulin aspart  0-15 Units Subcutaneous TID WC  . ipratropium-albuterol  3 mL Nebulization BID  . metoCLOPramide  5 mg Per Tube Q8H  . multivitamin with minerals  1 tablet Oral Q breakfast  . pantoprazole sodium  40 mg Per Tube QAC breakfast  . QUEtiapine  50 mg Per Tube Q12H  . rivaroxaban  20 mg Oral Q breakfast  . saccharomyces boulardii  250 mg Per Tube BID  . sertraline  100 mg Per Tube QAC breakfast  . Valproate Sodium  500 mg Per Tube Q12H    Continuous Infusions: . feeding supplement (JEVITY 1.2 CAL) 1,000 mL (04/17/17 1454)  . piperacillin-tazobactam (ZOSYN)  IV 3.375 g (04/17/17 1332)     Time spent:  Rogenia Werntz MD, PhD  [facility] Pager [phone]. If 7PM-7AM, please contact night-coverage at  [email or web address], password [facility] 04/17/2017, 4:51 PM  LOS: 2 days

## 2017-04-18 ENCOUNTER — Inpatient Hospital Stay (HOSPITAL_COMMUNITY): Payer: Medicare Other

## 2017-04-18 DIAGNOSIS — J690 Pneumonitis due to inhalation of food and vomit: Principal | ICD-10-CM

## 2017-04-18 DIAGNOSIS — J181 Lobar pneumonia, unspecified organism: Secondary | ICD-10-CM

## 2017-04-18 DIAGNOSIS — J189 Pneumonia, unspecified organism: Secondary | ICD-10-CM

## 2017-04-18 LAB — CBC WITH DIFFERENTIAL/PLATELET
BASOS ABS: 0 10*3/uL (ref 0.0–0.1)
BASOS PCT: 0 %
EOS ABS: 0.6 10*3/uL (ref 0.0–0.7)
Eosinophils Relative: 7 %
HEMATOCRIT: 27.3 % — AB (ref 39.0–52.0)
Hemoglobin: 8.6 g/dL — ABNORMAL LOW (ref 13.0–17.0)
Lymphocytes Relative: 34 %
Lymphs Abs: 2.8 10*3/uL (ref 0.7–4.0)
MCH: 27.4 pg (ref 26.0–34.0)
MCHC: 31.5 g/dL (ref 30.0–36.0)
MCV: 86.9 fL (ref 78.0–100.0)
Monocytes Absolute: 0.6 10*3/uL (ref 0.1–1.0)
Monocytes Relative: 7 %
NEUTROS ABS: 4.2 10*3/uL (ref 1.7–7.7)
Neutrophils Relative %: 52 %
Platelets: 254 10*3/uL (ref 150–400)
RBC: 3.14 MIL/uL — AB (ref 4.22–5.81)
RDW: 16.1 % — ABNORMAL HIGH (ref 11.5–15.5)
WBC: 8.2 10*3/uL (ref 4.0–10.5)

## 2017-04-18 LAB — MAGNESIUM: MAGNESIUM: 1.5 mg/dL — AB (ref 1.7–2.4)

## 2017-04-18 LAB — BASIC METABOLIC PANEL
ANION GAP: 4 — AB (ref 5–15)
BUN: 13 mg/dL (ref 6–20)
CO2: 28 mmol/L (ref 22–32)
CREATININE: 0.33 mg/dL — AB (ref 0.61–1.24)
Calcium: 8.4 mg/dL — ABNORMAL LOW (ref 8.9–10.3)
Chloride: 106 mmol/L (ref 101–111)
GFR calc non Af Amer: >60 mL/min (ref >60)
Glucose, Bld: 172 mg/dL — ABNORMAL HIGH (ref 65–99)
POTASSIUM: 3.8 mmol/L (ref 3.5–5.1)
SODIUM: 138 mmol/L (ref 135–145)

## 2017-04-18 LAB — GLUCOSE, CAPILLARY
GLUCOSE-CAPILLARY: 180 mg/dL — AB (ref 65–99)
GLUCOSE-CAPILLARY: 149 mg/dL — AB (ref 65–99)
GLUCOSE-CAPILLARY: 105 mg/dL — AB (ref 65–99)
GLUCOSE-CAPILLARY: 122 mg/dL — AB (ref 65–99)
Glucose-Capillary: 56 mg/dL — ABNORMAL LOW (ref 65–99)
Glucose-Capillary: 134 mg/dL — ABNORMAL HIGH (ref 65–99)

## 2017-04-18 MED ORDER — DEXTROSE 50 % IV SOLN
INTRAVENOUS | Status: AC
  Administered 2017-04-18: 50 mL
  Filled 2017-04-18: qty 50

## 2017-04-18 MED ORDER — IOPAMIDOL (ISOVUE-300) INJECTION 61%
INTRAVENOUS | Status: AC
  Filled 2017-04-18: qty 100

## 2017-04-18 MED ORDER — MORPHINE SULFATE (PF) 2 MG/ML IV SOLN
2.0000 mg | INTRAVENOUS | Status: AC | PRN
  Administered 2017-04-18 (×3): 2 mg via INTRAVENOUS
  Filled 2017-04-18 (×3): qty 1

## 2017-04-18 MED ORDER — ALBUTEROL SULFATE (2.5 MG/3ML) 0.083% IN NEBU
2.5000 mg | INHALATION_SOLUTION | RESPIRATORY_TRACT | Status: DC | PRN
  Administered 2017-04-18 – 2017-04-19 (×4): 2.5 mg via RESPIRATORY_TRACT
  Filled 2017-04-18 (×3): qty 3

## 2017-04-18 MED ORDER — MAGNESIUM SULFATE 2 GM/50ML IV SOLN
2.0000 g | Freq: Once | INTRAVENOUS | Status: AC
Start: 2017-04-18 — End: 2017-04-18
  Administered 2017-04-18: 2 g via INTRAVENOUS
  Filled 2017-04-18: qty 50

## 2017-04-18 MED ORDER — ALBUTEROL SULFATE (2.5 MG/3ML) 0.083% IN NEBU
INHALATION_SOLUTION | RESPIRATORY_TRACT | Status: AC
  Administered 2017-04-18: 2.5 mg via RESPIRATORY_TRACT
  Filled 2017-04-18: qty 3

## 2017-04-18 MED ORDER — IOPAMIDOL (ISOVUE-300) INJECTION 61%
100.0000 mL | Freq: Once | INTRAVENOUS | Status: AC | PRN
  Administered 2017-04-18: 100 mL via INTRAVENOUS

## 2017-04-18 MED ORDER — DEXTROSE-NACL 5-0.9 % IV SOLN
INTRAVENOUS | Status: AC
  Administered 2017-04-18 – 2017-04-19 (×2): via INTRAVENOUS

## 2017-04-18 MED ORDER — PROMETHAZINE HCL 25 MG/ML IJ SOLN
12.5000 mg | Freq: Four times a day (QID) | INTRAMUSCULAR | Status: AC | PRN
  Administered 2017-04-18 – 2017-04-19 (×3): 12.5 mg via INTRAVENOUS
  Filled 2017-04-18 (×3): qty 1

## 2017-04-18 NOTE — Consult Note (Signed)
PULMONARY / CRITICAL CARE MEDICINE   Name: Cory Montoya MRN: [id number] DOB: 10/13/1961    ADMISSION DATE:  04/15/2017 CONSULTATION DATE:  04/18/2017  REFERRING MD:  Roda Shutters  CHIEF COMPLAINT:  Hypoxemia  HISTORY OF PRESENT ILLNESS:   56 y/o male with a past medical history of DM2, DKA admitted from [facility] on 6/13 with acute on chronic respiratory failure with hypoxemia in the setting of RLL pneumonia.  By my review of the records he had a prolonged hospitalization about 6 months ago after an episode of DKA eventually requiring trache/peg.  For more details see admission H&P.  PCCM consulted today because of some hypoxemia he had last night.  He has a known history of aspiraiton pneumonia and has been told to remain NPO but he refuses and still eats and drinks.  He has a right lower lobe pneumonia.  This morning he feels better.  Apparently he had his tracheostomy removed at [facility] in the last few days.  He denies dyspnea or dyscomfort for me this morning.  His voice is weak.  PAST MEDICAL HISTORY :  He  has a past medical history of Bipolar disorder (HCC); C. difficile diarrhea; Diabetes mellitus without complication (HCC); DKA (diabetic ketoacidoses) (HCC); Metabolic encephalopathy; Pressure ulcer; and Respiratory failure (HCC).  PAST SURGICAL HISTORY: He  has no past surgical history on file.  Allergies  Allergen Reactions  . Codeine     Unknown reaction per MAR   . Keflex [Cephalexin]     Unknown reaction per MAR   . Ketorolac     Unknown reaction per MAR     No current facility-administered medications on file prior to encounter.    No current outpatient prescriptions on file prior to encounter.    FAMILY HISTORY:  His has no family status information on file.    SOCIAL HISTORY: He  reports that he has quit smoking. He has never used smokeless tobacco. He reports that he uses drugs, including Marijuana. He reports that he does not drink alcohol.  REVIEW OF SYSTEMS:   Gen:  Denies fever, chills, weight change, fatigue, night sweats HEENT: Denies blurred vision, double vision, hearing loss, tinnitus, sinus congestion, rhinorrhea, sore throat, neck stiffness, dysphagia PULM: Denies shortness of breath, cough, sputum production, hemoptysis, wheezing CV: Denies chest pain, edema, orthopnea, paroxysmal nocturnal dyspnea, palpitations GI: Denies abdominal pain, nausea, vomiting, diarrhea, hematochezia, melena, constipation, change in bowel habits GU: Denies dysuria, hematuria, polyuria, oliguria, urethral discharge Endocrine: Denies hot or cold intolerance, polyuria, polyphagia or appetite change Derm: Denies rash, dry skin, scaling or peeling skin change Heme: Denies easy bruising, bleeding, bleeding gums Neuro: Denies headache, numbness, weakness, slurred speech, loss of memory or consciousness   SUBJECTIVE:  As above  VITAL SIGNS: BP 99/70 (BP Location: Left Arm)   Pulse (!) 115   Temp 98.5 F (36.9 C) (Oral)   Resp 18   Ht 5\' 8"  (1.727 m)   Wt 184 lb (83.5 kg)   SpO2 95%   BMI 27.98 kg/m   HEMODYNAMICS:    VENTILATOR SETTINGS:    INTAKE / OUTPUT: I/O last 3 completed shifts: In: 2963.5 [NG/GT:2663.5; IV Piggyback:300] Out: 800 [Urine:800]  PHYSICAL EXAMINATION: General:  Sitting up in chair, comfortable Neuro:  Awake, alert, no distress HEENT:  NCAT trach side well healed; voice is very weak, can't phonate Cardiovascular:  RRR, no mgr Lungs:  Diminished RLL, normal effort clear otherwise Abdomen:  BS+, soft, notnender Musculoskeletal:  Normal bulk and tone Skin:  No  rash or skin breakdown  LABS:  BMET  Recent Labs Lab 04/16/17 0819 04/17/17 0456 04/18/17 0410  NA 141 138 138  K 3.7 3.5 3.8  CL 104 104 106  CO2 28 26 28   BUN 19 16 13   CREATININE 0.40* 0.44* 0.33*  GLUCOSE 129* 116* 172*    Electrolytes  Recent Labs Lab 04/16/17 0819 04/17/17 0456 04/18/17 0410  CALCIUM 8.9 8.6* 8.4*  MG  --   --  1.5*     CBC  Recent Labs Lab 04/16/17 0819 04/17/17 0456 04/18/17 0410  WBC 10.9* 8.6 8.2  HGB 8.4* 8.8* 8.6*  HCT 26.5* 28.2* 27.3*  PLT 321 264 254    Coag's No results for input(s): APTT, INR in the last 168 hours.  Sepsis Markers  Recent Labs Lab 04/15/17 1228  LATICACIDVEN 0.79    ABG No results for input(s): PHART, PCO2ART, PO2ART in the last 168 hours.  Liver Enzymes  Recent Labs Lab 04/15/17 1159 04/16/17 0819  AST 17 18  ALT 16* 13*  ALKPHOS 59 58  BILITOT 0.4 0.6  ALBUMIN 3.1* 3.3*    Cardiac Enzymes No results for input(s): TROPONINI, PROBNP in the last 168 hours.  Glucose  Recent Labs Lab 04/16/17 2131 04/17/17 0735 04/17/17 1131 04/17/17 1638 04/18/17 0155 04/18/17 0739  GLUCAP 72 120* 120* 119* 180* 149*    Imaging Dg Abd 1 View  Result Date: 04/17/2017 CLINICAL DATA:  Feeding tube site pain this afternoon. EXAM: ABDOMEN - 1 VIEW COMPARISON:  CT of the abdomen and pelvis 04/15/2017 FINDINGS: Bowel gas pattern is nonobstructive. Residual contrast is identified within bowel loops following CT on 06/13. Surgical clips are identified in the right lower quadrant. No evidence for free intraperitoneal air. IMPRESSION: Nonobstructive bowel gas pattern. Electronically Signed   By: Norva Pavlov M.D.   On: 04/17/2017 18:16   Dg Swallowing Func-speech Pathology  Result Date: 04/17/2017 Objective Swallowing Evaluation: Type of Study: MBS-Modified Barium Swallow Study Patient Details Name: Cory Montoya MRN: [id number] Date of Birth: 07-11-1961 Today's Date: 04/17/2017 Time: SLP Start Time (ACUTE ONLY): 1240-SLP Stop Time (ACUTE ONLY): 1310 SLP Time Calculation (min) (ACUTE ONLY): 30 min Past Medical History: Past Medical History: Diagnosis Date . Bipolar disorder (HCC)  . C. difficile diarrhea  . Diabetes mellitus without complication (HCC)  . DKA (diabetic ketoacidoses) (HCC)  . Metabolic encephalopathy  . Pressure ulcer  . Respiratory failure (HCC)   Past Surgical History: No past surgical history on file. HPI: Cory Penickis a 56 y.o.malewith a past medical history of chronic respiratory failure, tracheostomy, PEG tube, DVT, diabetes, bipolar disorder who was at the [facility] and who was sent over here due to bizarre behavior over the last few days. Pt was [facility] to [facility] from [facility]. He had to be intubated. Could not be weaned off and had to have a tracheostomy tube placed. He also has a PEG tube. At the [facility] facility he was weaned off of his ventilator. He has been decannulated. 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 Has had multiple FEES with no improvement from therapeutic interventions. Has been evaluated by ENT. Reportedly he also drink from the faucet or the saline in the room. Pt noted to have left lingual deviation, he reports a history of stroke, but this is not in his chart. No Data Recorded Assessment /  Plan / Recommendation CHL IP CLINICAL IMPRESSIONS 04/17/2017 Clinical Impression Pt demonstrates a severe oropharygneal and esophageal dysphagia with concern for neuromuscular impairment impacting CN XII and X as well as known vocal fold injury/paralysis. There is lingual deviation to the left, reduced hyolaryngeal elevation, excursion, epiglottic deflection and UES opening as well as appearance of poor negative pharyngeal pressure to pull bolus through pharynx. Liquids boluses spill to the pyriforms and is pushed into the vestibule and falls past the cords as the swallow is initiated, without sensation. Puree is not aspirated, but stasis is severe. A deep chin tuck, with neck fully horizontal (slightly forward leaning) improves bolus transit with minimal residuals with puree and solids. Of note, there is appearance of minimal esophageal persitalsis and  after about 2 oz of solids, residuals increase due to esophageal backflow. Recommend pt initaite trials of puree and pudding with a deep chin tuck with full SLP supervision. If this is tolerated, could consider allowing small trials of solids if well masticated under supervision (pt really wants a hamburger). No liquids recommended due to severity of aspiration. Will follow for tolerance.  SLP Visit Diagnosis Dysphagia, oropharyngeal phase (R13.12) Attention and concentration deficit following -- Frontal lobe and executive function deficit following -- Impact on safety and function Severe aspiration risk   CHL IP TREATMENT RECOMMENDATION 04/17/2017 Treatment Recommendations Therapy as outlined in treatment plan below   No flowsheet data found. CHL IP DIET RECOMMENDATION 04/17/2017 SLP Diet Recommendations Dysphagia 1 (Puree) solids Liquid Administration via -- Medication Administration -- Compensations -- Postural Changes --   CHL IP OTHER RECOMMENDATIONS 04/17/2017 Recommended Consults -- Oral Care Recommendations Oral care QID Other Recommendations --   CHL IP FOLLOW UP RECOMMENDATIONS 04/17/2017 Follow up Recommendations Skilled Nursing facility   [facility] IP FREQUENCY AND DURATION 04/17/2017 Speech Therapy Frequency (ACUTE ONLY) min 2x/week Treatment Duration 2 weeks      CHL IP ORAL PHASE 04/17/2017 Oral Phase WFL Oral - Pudding Teaspoon -- Oral - Pudding Cup -- Oral - Honey Teaspoon -- Oral - Honey Cup -- Oral - Nectar Teaspoon -- Oral - Nectar Cup -- Oral - Nectar Straw -- Oral - Thin Teaspoon -- Oral - Thin Cup -- Oral - Thin Straw -- Oral - Puree -- Oral - Mech Soft -- Oral - Regular -- Oral - Multi-Consistency -- Oral - Pill -- Oral Phase - Comment --  CHL IP PHARYNGEAL PHASE 04/17/2017 Pharyngeal Phase Impaired Pharyngeal- Pudding Teaspoon -- Pharyngeal -- Pharyngeal- Pudding Cup -- Pharyngeal -- Pharyngeal- Honey Teaspoon Reduced laryngeal elevation;Reduced airway/laryngeal closure;Reduced anterior laryngeal  mobility;Reduced epiglottic inversion;Reduced pharyngeal peristalsis;Penetration/Aspiration during swallow;Penetration/Aspiration before swallow;Significant aspiration (Amount);Pharyngeal residue - valleculae;Pharyngeal residue - pyriform;Compensatory strategies attempted (with notebox) Pharyngeal Material enters airway, passes BELOW cords without attempt by patient to eject out (silent aspiration) Pharyngeal- Honey Cup -- Pharyngeal -- Pharyngeal- Nectar Teaspoon Reduced laryngeal elevation;Reduced airway/laryngeal closure;Reduced anterior laryngeal mobility;Reduced epiglottic inversion;Reduced pharyngeal peristalsis;Penetration/Aspiration during swallow;Penetration/Aspiration before swallow;Significant aspiration (Amount);Pharyngeal residue - valleculae;Pharyngeal residue - pyriform;Compensatory strategies attempted (with notebox) Pharyngeal Material enters airway, passes BELOW cords without attempt by patient to eject out (silent aspiration) Pharyngeal- Nectar Cup -- Pharyngeal -- Pharyngeal- Nectar Straw -- Pharyngeal -- Pharyngeal- Thin Teaspoon -- Pharyngeal -- Pharyngeal- Thin Cup -- Pharyngeal -- Pharyngeal- Thin Straw -- Pharyngeal -- Pharyngeal- Puree Reduced pharyngeal peristalsis;Reduced epiglottic inversion;Reduced anterior laryngeal mobility;Reduced laryngeal elevation;Reduced airway/laryngeal closure;Reduced tongue base retraction;Pharyngeal residue - pyriform;Pharyngeal residue - valleculae Pharyngeal -- Pharyngeal- Mechanical Soft -- Pharyngeal -- Pharyngeal- Regular -- Pharyngeal -- Pharyngeal- Multi-consistency -- Pharyngeal --  Pharyngeal- Pill -- Pharyngeal -- Pharyngeal Comment --  CHL IP CERVICAL ESOPHAGEAL PHASE 04/17/2017 Cervical Esophageal Phase Impaired Pudding Teaspoon -- Pudding Cup -- Honey Teaspoon Reduced cricopharyngeal relaxation;Esophageal backflow into cervical esophagus;Esophageal backflow into the pharynx Honey Cup -- Nectar Teaspoon Reduced cricopharyngeal relaxation;Esophageal  backflow into cervical esophagus;Esophageal backflow into the pharynx Nectar Cup -- Nectar Straw -- Thin Teaspoon -- Thin Cup -- Thin Straw -- Puree Reduced cricopharyngeal relaxation;Esophageal backflow into cervical esophagus;Esophageal backflow into the pharynx Mechanical Soft -- Regular Reduced cricopharyngeal relaxation;Esophageal backflow into cervical esophagus;Esophageal backflow into the pharynx Multi-consistency -- Pill -- Cervical Esophageal Comment severe esophageal stasis with puree No flowsheet data found. Harlon DittyBonnie DeBlois, MA CCC-SLP [phone] DeBlois, Riley NearingBonnie Caroline 04/17/2017, 2:32 PM                STUDIES:  6/16 CT chest reviewed> RLL consolidation, subsegmental atelectasis, RML tree-in-bud abnormalities, patchy consolidation left lower lobe   DISCUSSION: He has recurrent aspiration pneumonia because he continues to take by mouth.  He has bascialy no voice which means his vocal cords probably don't completely come together, this greatly increases his aspiration risk.  At this point he needs treatment for his pneumonia, chest PT, and needs to see ENT for a larygoscopy.  This could be done as an outpatient after he recovers from his pneumonia.  ASSESSMENT / PLAN:  PULMONARY A: HCAP Aspiration pneumonia aphonia P:   Continue antibiotics Chest PT [place] E[facility] evaluation> outpatient OK NPO> strict  PCCM will sign off   Heber CarolinaBrent Olyver Hawes, MD [place] [facility] Pager: [phone] Cell: [phone] After 3pm or if no response, call [phone]

## 2017-04-18 NOTE — Progress Notes (Signed)
Patient vomiting.  Notified physician.  Physician orders stop tube feedings.

## 2017-04-18 NOTE — Progress Notes (Signed)
   04/17/17 2050  What Happened  Was fall witnessed? No  Was patient injured? No  Patient found on floor  Found by Staff-comment (RN)  Stated prior activity other (comment) (sitting up in bed)  Follow Up  MD notified Daphane ShepherdM Lynch  Time MD notified 2105  Family notified Yes-comment (pt called his daughter)  Time family notified 2115  Additional tests No  Progress note created (see row info) Yes  Adult Fall Risk Assessment  Risk Factor Category (scoring not indicated) Fall has occurred during this admission (document High fall risk);High fall risk per protocol (document High fall risk)  Age 56  Fall History: Fall within 6 months prior to admission 0  Elimination; Bowel and/or Urine Incontinence 0  Elimination; Bowel and/or Urine Urgency/Frequency 0  Medications: includes PCA/Opiates, Anti-convulsants, Anti-hypertensives, Diuretics, Hypnotics, Laxatives, Sedatives, and Psychotropics 3  Patient Care Equipment 2  Mobility-Assistance 2  Mobility-Gait 2  Mobility-Sensory Deficit 0  Altered awareness of immediate physical environment 0  Impulsiveness 0  Lack of understanding of one's physical/cognitive limitations 0  Total Score 9  Patient's Fall Risk High Fall Risk (>13 points)  Adult Fall Risk Interventions  Required Bundle Interventions *See Row Information* High fall risk - low, moderate, and high requirements implemented  Additional Interventions Camera surveillance (with patient/family notification & education);Use of appropriate toileting equipment (bedpan, BSC, etc.)  Screening for Fall Injury Risk  Risk For Fall Injury- See Row Information  None identified  Vitals  Temp 98.5 F (36.9 C)  Temp Source Oral  BP 116/83  BP Location Left Arm  BP Method Automatic  Patient Position (if appropriate) Lying  Pulse Rate (!) 103  Pulse Rate Source Dinamap  Resp 18  Oxygen Therapy  SpO2 93 %  O2 Device Room Air  Pain Assessment  Pain Assessment 0-10  Pain Score 0  Neurological   Neuro (WDL) X  Level of Consciousness Alert  Orientation Level Oriented X4  Cognition Appropriate at baseline;Follows commands  Speech Other (Comment) (soft voive, pt whispers)  Pupil Assessment  No  Musculoskeletal  Musculoskeletal (WDL) X  Assistive Device Standard walker  Generalized Weakness Yes  Weight Bearing Restrictions No  Musculoskeletal Details  LUE Ortho/Supportive Device  LUE Ortho/Supportive Device Brace (Comment)  Integumentary  Integumentary (WDL) X  Skin Color Appropriate for ethnicity  Description of Blister Serous  Blister Location Toe (Comment  which one)  Blister Location Orientation Left  Skin Turgor Non-tenting

## 2017-04-18 NOTE — Progress Notes (Addendum)
PROGRESS NOTE  Cory Montoya RUE:[id number] DOB: 1961-09-29 DOA: 04/15/2017 PCP: System, Provider Not In  HPI/Recap of past 24 hours:  Larey Seat yesterday evening around 9 pm, Worsening hypoxia last night,  on 2liter oxygen,  Poor historian, c/o chest pain, abdominal pain and leg pain which are chronic he vomited the tube feeds this am, no diarrhea, no fever   Assessment/Plan: Principal Problem:   Aspiration pneumonia (HCC) Active Problems:   Bipolar affective disorder, manic, mild (HCC)   Tracheostomy present (HCC)   PEG (percutaneous endoscopic gastrostomy) status (HCC)   Chronic respiratory failure with hypoxia (HCC)   Normocytic anemia   Diabetes mellitus type 2 in nonobese (HCC)   Protein-calorie malnutrition, severe   Acute on chronic hypoxic respiratory failure likely due to aspiration pneumonia:  -I have reviewed careeverywhere extensively for past medical history, per care everywhere, patient was found down in prison and sent to local hospital intubated, he was transferred to [facility] and later has to be trached and pegged and discharged to [facility], he has been at the [facility] for the last 6months,  trach removed two days ago at [facility],  -patient is sent from [facility]  Due to behavior issues, He gets all feedings through his PEG tube. He is supposedly to be strict NPO, but per nursing staff at [facility], he still drinks off the faucet and drink any thing that he can get to , even drinks off from the saline bottle -he is found to have acute hypoxia and right lower lobe pneumonia --He required 6 literoxygen supplement initially to keep o2 sats at 90% --he is started on vancomycin and aztreonam since admission. vanc discontinued (mrsa screening negative, and this is likely aspiration penumonia), aztreonam changed to zosyn, blood culture no growth so far, sputum culture pending recollection -developed hypoxia last night, repeat CT chest , pulm consult who recommend continue abx  and outpatient ENT follow up due to aphonia ( possible vocal cord dysfunction another cause of aspiration of pneumonia)   Vomiting:  --CT ab/pel with iv contrast "Moderate descending, sigmoid and rectal wall thickening suggesting an inflammatory or infectious colitis." which is new compare to ct ab obtained on 6/13  --He does report central abdominal pain which is chronic, he denies left sided abdominal pain, denies pain with defecation, denies diarrhea --will hold off tube feeds today, start ivf, continue keep strict npo -he is on zosyn , consider testing for c diff if diarrhea, prn phenegran and prn morphine ordered for symptom control  Dysphagia: Secondary to tracheostomy. Speech therapy to see. He is on tube feedings. dietitian input apprecaited. Tube feedings with free water flush initiated. Check residuals. Continue Reglan  Addendum: barium swallow  Concern for "there is appearance of minimal esophageal persitalsis and after about 2 oz of solids, residuals increase due to esophageal backflow" He is on reglan   Bacteriuria:  urine culture 20.000 colonies of ecoli, patient denies urinary symptom  History of C. difficile:  No history of diarrhea recently. Looks like he was treated for C. difficile while he was at [facility]. Continue Probiotics.  Full thickness, Stage 3 tissue loss/pressure injury  to two areas; left buttock and left ischial tuberosity, presented on admission Wound type:Pressure plus moisture, shear Wound care nurse will be consulted, input appreciated  History of DVT: Continue Xarelto.   Normocytic anemia: Baseline hemoglobin is not known. No evidence for overt bleeding. Continue to monitor hemoglobin. Check anemia panel   Diabetes mellitus type 2:  he  report he has chronic peripheral neuropathy from diabetes.  He is not noted to be on any long-term medications.  SSI. Continue neurontin HbA1c  Chronic pain: . He request fentanyl dose to be doubled, he  request prn percocet and ultram, controlled substance data base did not show record of chronic pain meds in the past, I assume this meds was started either while he was hospitalized 6months ago or from the LTAC He is currently on fentanyl dose that he has been on at [facility], will continue current dose,  Prn percocet ordered per dose from controlled substance dase base  Continue prn ultram  He report peg exit site pain:  He report he started to have this pain since after peg placement Ct ab "Feeding tube is in place with the tip in the stomach. The patient is status post gastric bypass without evidence complication. The colon appears normal. No evidence of appendicitis." No erythema or drainage on inspection.  History of bipolar disorder with recent bizarre behavior:  CT head did not show any acute findings. Continue Seroquel, valproic acid which was recently started from LTAC  He is also noted to be on gabapentin. 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   Prior h/o cva with left sided hemiparesis   Difficult social situation:  Daughter report his father has been in and out of their life for a long time, they donot know a lot of about him until he was in the hospital and intubated.  Social worker consulted  DVT Prophylaxis: On anticoagulation  Code Status: Full code  Family Communication: daughter [name] over  The phone at [phone] Disposition Plan : SNF vs home health   Consultants:  Speech/PT/OT/respiratory therapy  Procedures:  none  Antibiotics:  vanc /azteronam from admission to 6/14  Zosyn from 6/14   Objective: BP 99/70 (BP Location: Left Arm)   Pulse (!) 115   Temp 98.5 F (36.9 C) (Oral)   Resp 18   Ht 5\' 8"  (1.727 m)   Wt 83.5 kg (184 lb)   SpO2 95%   BMI 27.98 kg/m   Intake/Output Summary (Last 24 hours) at 04/18/17 0736 Last data filed at 04/18/17 0981  Gross per 24 hour  Intake            2913.5 ml  Output              800 ml  Net           2113.5 ml   Filed Weights   04/15/17 1156 04/17/17 0833  Weight: 79.8 kg (176 lb) 83.5 kg (184 lb)    Exam:   General:  Frail, chronically ill appearing,  tracheostomy site healed but still aphonic   Cardiovascular: sinus tachycardia  Respiratory: diminished at right basis, crackles at right basis, some rhonchi, no wheezing, left lung clear, respiratory effort wnl  Abdomen:  Peg tube in place, Soft/ND/NT, positive BS  Musculoskeletal: No Edema  Neuro: aaox3, left arm with chronic contraction, left lower extremity weakness from prior cva Skin: Stage II decubiti noted in the buttock and sacral area, dressing in place  Data Reviewed: Basic Metabolic Panel:  Recent Labs Lab 04/15/17 1159 04/16/17 0819 04/17/17 0456 04/18/17 0410  NA 139 141 138 138  K 4.4 3.7 3.5 3.8  CL 101 104 104 106  CO2 29 28 26 28   GLUCOSE 189* 129* 116* 172*  BUN 28* 19 16 13   CREATININE 0.53* 0.40* 0.44* 0.33*  CALCIUM 9.1 8.9 8.6* 8.4*  MG  --   --   --  1.5*   Liver Function Tests:  Recent Labs Lab 04/15/17 1159 04/16/17 0819  AST 17 18  ALT 16* 13*  ALKPHOS 59 58  BILITOT 0.4 0.6  PROT 7.3 7.5  ALBUMIN 3.1* 3.3*   No results for input(s): LIPASE, AMYLASE in the last 168 hours. No results for input(s): AMMONIA in the last 168 hours. CBC:  Recent Labs Lab 04/15/17 1159 04/16/17 0819 04/17/17 0456 04/18/17 0410  WBC 16.7* 10.9* 8.6 8.2  NEUTROABS 14.3*  --  5.0 4.2  HGB 9.8* 8.4* 8.8* 8.6*  HCT 31.5* 26.5* 28.2* 27.3*  MCV 85.8 86.9 85.7 86.9  PLT 297 321 264 254   Cardiac Enzymes:   No results for input(s): CKTOTAL, CKMB, CKMBINDEX, TROPONINI in the last 168 hours. BNP (last 3 results) No results for input(s): BNP in the last 8760 hours.  ProBNP (last 3 results) No results for input(s): PROBNP in the last 8760 hours.  CBG:  Recent Labs Lab 04/16/17 2131 04/17/17 0735 04/17/17 1131 04/17/17 1638  04/18/17 0155  GLUCAP 72 120* 120* 119* 180*    Recent Results (from the past 240 hour(s))  Urine culture     Status: Abnormal   Collection Time: 04/15/17 11:34 AM  Result Value Ref Range Status   Specimen Description URINE, CLEAN CATCH  Final   Special Requests NONE  Final   Culture 20,000 COLONIES/mL ESCHERICHIA COLI (A)  Final   Report Status 04/17/2017 FINAL  Final   Organism ID, Bacteria ESCHERICHIA COLI (A)  Final      Susceptibility   Escherichia coli - MIC*    AMPICILLIN 16 INTERMEDIATE Intermediate     CEFAZOLIN <=4 SENSITIVE Sensitive     CEFTRIAXONE <=1 SENSITIVE Sensitive     CIPROFLOXACIN <=0.25 SENSITIVE Sensitive     GENTAMICIN <=1 SENSITIVE Sensitive     IMIPENEM <=0.25 SENSITIVE Sensitive     NITROFURANTOIN <=16 SENSITIVE Sensitive     TRIMETH/SULFA <=20 SENSITIVE Sensitive     AMPICILLIN/SULBACTAM 4 SENSITIVE Sensitive     PIP/TAZO <=4 SENSITIVE Sensitive     Extended ESBL NEGATIVE Sensitive     * 20,000 COLONIES/mL ESCHERICHIA COLI  Blood culture (routine x 2)     Status: None (Preliminary result)   Collection Time: 04/15/17 12:40 PM  Result Value Ref Range Status   Specimen Description BLOOD PICC LINE  Final   Special Requests   Final    BOTTLES DRAWN AEROBIC AND ANAEROBIC Blood Culture adequate volume   Culture   Final    NO GROWTH 2 DAYS Performed at [facility], [address] [place], [place] [address]    Report Status PENDING  Incomplete  Blood culture (routine x 2)     Status: None (Preliminary result)   Collection Time: 04/15/17 12:40 PM  Result Value Ref Range Status   Specimen Description BLOOD RIGHT FOREARM  Final   Special Requests   Final    BOTTLES DRAWN AEROBIC AND ANAEROBIC Blood Culture adequate volume   Culture   Final    NO GROWTH 2 DAYS Performed at [facility], [address] [place], [place] [address]    Report Status PENDING  Incomplete  Culture, expectorated sputum-assessment     Status: None   Collection  Time: 04/16/17 11:28 AM  Result Value Ref Range Status   Specimen Description EXPECTORATED SPUTUM  Final   Special Requests NONE  Final   Sputum evaluation   Final    Sputum specimen not acceptable for testing.  Please recollect.   Gram Stain Report Called to,Read Back By and Verified With: D LOCK,RN @1238  04/16/17 MKELLY    Report Status 04/16/2017 FINAL  Final  MRSA PCR Screening     Status: None   Collection Time: 04/16/17 11:30 AM  Result Value Ref Range Status   MRSA by PCR NEGATIVE NEGATIVE Final    Comment:        The GeneXpert MRSA Assay (FDA approved for NASAL specimens only), is one component of a comprehensive MRSA colonization surveillance program. It is not intended to diagnose MRSA infection nor to guide or monitor treatment for MRSA infections.      Studies: Dg Abd 1 View  Result Date: 04/17/2017 CLINICAL DATA:  Feeding tube site pain this afternoon. EXAM: ABDOMEN - 1 VIEW COMPARISON:  CT of the abdomen and pelvis 04/15/2017 FINDINGS: Bowel gas pattern is nonobstructive. Residual contrast is identified within bowel loops following CT on 06/13. Surgical clips are identified in the right lower quadrant. No evidence for free intraperitoneal air. IMPRESSION: Nonobstructive bowel gas pattern. Electronically Signed   By: Norva Pavlov M.D.   On: 04/17/2017 18:16   Dg Swallowing Func-speech Pathology  Result Date: 04/17/2017 Objective Swallowing Evaluation: Type of Study: MBS-Modified Barium Swallow Study Patient Details Name: Cory Montoya MRN: [id number] Date of Birth: December 14, 1960 Today's Date: 04/17/2017 Time: SLP Start Time (ACUTE ONLY): 1240-SLP Stop Time (ACUTE ONLY): 1310 SLP Time Calculation (min) (ACUTE ONLY): 30 min Past Medical History: Past Medical History: Diagnosis Date . Bipolar disorder (HCC)  . C. difficile diarrhea  . Diabetes mellitus without complication (HCC)  . DKA (diabetic ketoacidoses) (HCC)  . Metabolic encephalopathy  . Pressure ulcer  . Respiratory  failure (HCC)  Past Surgical History: No past surgical history on file. HPI: Kijuan Penickis a 56 y.o.malewith a past medical history of chronic respiratory failure, tracheostomy, PEG tube, DVT, diabetes, bipolar disorder who was at the [facility] and who was sent over here due to bizarre behavior over the last few days. Pt [facility] to [facility] from another facility where he [facility] a prison [facility]. He had to be intubated. Could not be weaned off and had to have a tracheostomy tube placed. He also has a PEG tube. At the [facility] facility he was weaned off of his ventilator. He has been decannulated. 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 Has had multiple FEES with no improvement from therapeutic interventions. Has been evaluated by ENT. Reportedly he also drink from the faucet or the saline in the room. Pt noted to have left lingual deviation, he reports a history of stroke, but this is not in his chart. No Data Recorded Assessment / Plan / Recommendation CHL IP CLINICAL IMPRESSIONS 04/17/2017 Clinical Impression Pt demonstrates a severe oropharygneal and esophageal dysphagia with concern for neuromuscular impairment impacting CN XII and X as well as known vocal fold injury/paralysis. There is lingual deviation to the left, reduced hyolaryngeal elevation, excursion, epiglottic deflection and UES opening as well as appearance of poor negative pharyngeal pressure to pull bolus through pharynx. Liquids boluses spill to the pyriforms and is pushed into the vestibule and falls past the cords as the swallow is initiated, without sensation. Puree is not aspirated, but stasis is severe. A deep chin tuck, with neck fully horizontal (  slightly forward leaning) improves bolus transit with minimal residuals with puree and solids. Of note, there is appearance of minimal esophageal  persitalsis and after about 2 oz of solids, residuals increase due to esophageal backflow. Recommend pt initaite trials of puree and pudding with a deep chin tuck with full SLP supervision. If this is tolerated, could consider allowing small trials of solids if well masticated under supervision (pt really wants a hamburger). No liquids recommended due to severity of aspiration. Will follow for tolerance.  SLP Visit Diagnosis Dysphagia, oropharyngeal phase (R13.12) Attention and concentration deficit following -- Frontal lobe and executive function deficit following -- Impact on safety and function Severe aspiration risk   CHL IP TREATMENT RECOMMENDATION 04/17/2017 Treatment Recommendations Therapy as outlined in treatment plan below   No flowsheet data found. CHL IP DIET RECOMMENDATION 04/17/2017 SLP Diet Recommendations Dysphagia 1 (Puree) solids Liquid Administration via -- Medication Administration -- Compensations -- Postural Changes --   CHL IP OTHER RECOMMENDATIONS 04/17/2017 Recommended Consults -- Oral Care Recommendations Oral care QID Other Recommendations --   CHL IP FOLLOW UP RECOMMENDATIONS 04/17/2017 Follow up Recommendations Skilled Nursing facility   [facility] IP FREQUENCY AND DURATION 04/17/2017 Speech Therapy Frequency (ACUTE ONLY) min 2x/week Treatment Duration 2 weeks      CHL IP ORAL PHASE 04/17/2017 Oral Phase WFL Oral - Pudding Teaspoon -- Oral - Pudding Cup -- Oral - Honey Teaspoon -- Oral - Honey Cup -- Oral - Nectar Teaspoon -- Oral - Nectar Cup -- Oral - Nectar Straw -- Oral - Thin Teaspoon -- Oral - Thin Cup -- Oral - Thin Straw -- Oral - Puree -- Oral - Mech Soft -- Oral - Regular -- Oral - Multi-Consistency -- Oral - Pill -- Oral Phase - Comment --  CHL IP PHARYNGEAL PHASE 04/17/2017 Pharyngeal Phase Impaired Pharyngeal- Pudding Teaspoon -- Pharyngeal -- Pharyngeal- Pudding Cup -- Pharyngeal -- Pharyngeal- Honey Teaspoon Reduced laryngeal elevation;Reduced airway/laryngeal closure;Reduced anterior  laryngeal mobility;Reduced epiglottic inversion;Reduced pharyngeal peristalsis;Penetration/Aspiration during swallow;Penetration/Aspiration before swallow;Significant aspiration (Amount);Pharyngeal residue - valleculae;Pharyngeal residue - pyriform;Compensatory strategies attempted (with notebox) Pharyngeal Material enters airway, passes BELOW cords without attempt by patient to eject out (silent aspiration) Pharyngeal- Honey Cup -- Pharyngeal -- Pharyngeal- Nectar Teaspoon Reduced laryngeal elevation;Reduced airway/laryngeal closure;Reduced anterior laryngeal mobility;Reduced epiglottic inversion;Reduced pharyngeal peristalsis;Penetration/Aspiration during swallow;Penetration/Aspiration before swallow;Significant aspiration (Amount);Pharyngeal residue - valleculae;Pharyngeal residue - pyriform;Compensatory strategies attempted (with notebox) Pharyngeal Material enters airway, passes BELOW cords without attempt by patient to eject out (silent aspiration) Pharyngeal- Nectar Cup -- Pharyngeal -- Pharyngeal- Nectar Straw -- Pharyngeal -- Pharyngeal- Thin Teaspoon -- Pharyngeal -- Pharyngeal- Thin Cup -- Pharyngeal -- Pharyngeal- Thin Straw -- Pharyngeal -- Pharyngeal- Puree Reduced pharyngeal peristalsis;Reduced epiglottic inversion;Reduced anterior laryngeal mobility;Reduced laryngeal elevation;Reduced airway/laryngeal closure;Reduced tongue base retraction;Pharyngeal residue - pyriform;Pharyngeal residue - valleculae Pharyngeal -- Pharyngeal- Mechanical Soft -- Pharyngeal -- Pharyngeal- Regular -- Pharyngeal -- Pharyngeal- Multi-consistency -- Pharyngeal -- Pharyngeal- Pill -- Pharyngeal -- Pharyngeal Comment --  CHL IP CERVICAL ESOPHAGEAL PHASE 04/17/2017 Cervical Esophageal Phase Impaired Pudding Teaspoon -- Pudding Cup -- Honey Teaspoon Reduced cricopharyngeal relaxation;Esophageal backflow into cervical esophagus;Esophageal backflow into the pharynx Honey Cup -- Nectar Teaspoon Reduced cricopharyngeal  relaxation;Esophageal backflow into cervical esophagus;Esophageal backflow into the pharynx Nectar Cup -- Nectar Straw -- Thin Teaspoon -- Thin Cup -- Thin Straw -- Puree Reduced cricopharyngeal relaxation;Esophageal backflow into cervical esophagus;Esophageal backflow into the pharynx Mechanical Soft -- Regular Reduced cricopharyngeal relaxation;Esophageal backflow into cervical esophagus;Esophageal backflow into the pharynx Multi-consistency -- Pill -- Cervical Esophageal Comment  severe esophageal stasis with puree No flowsheet data found. Harlon Ditty, MA CCC-SLP [phone] [place], Riley Nearing 04/17/2017, 2:32 PM               Scheduled Meds: . clonazePAM  0.5 mg Per Tube Q8H  . famotidine  20 mg Per Tube Q12H  . fentaNYL  12.5 mcg Transdermal Q72H  . free water  200 mL Per Tube Q6H  . gabapentin  300 mg Per Tube Q8H  . insulin aspart  0-15 Units Subcutaneous TID WC  . ipratropium-albuterol  3 mL Nebulization BID  . metoCLOPramide  5 mg Per Tube Q8H  . multivitamin with minerals  1 tablet Oral Q breakfast  . pantoprazole sodium  40 mg Per Tube QAC breakfast  . QUEtiapine  50 mg Per Tube Q12H  . rivaroxaban  20 mg Oral Q breakfast  . saccharomyces boulardii  250 mg Per Tube BID  . sertraline  100 mg Per Tube QAC breakfast  . Valproate Sodium  500 mg Per Tube Q12H    Continuous Infusions: . feeding supplement (JEVITY 1.2 CAL) 1,000 mL (04/18/17 0000)  . magnesium sulfate 1 - 4 g bolus IVPB    . piperacillin-tazobactam (ZOSYN)  IV 3.375 g (04/18/17 0545)     Time spent:  Livia Tarr MD, PhD  [facility] Pager [phone]. If 7PM-7AM, please contact night-coverage at [email or web address], password [facility] 04/18/2017, 7:36 AM  LOS: 3 days

## 2017-04-18 NOTE — Progress Notes (Signed)
  Speech Language Pathology Treatment: Dysphagia  Patient Details Name: Cory Montoya MRN: [id number] DOB: 08/12/1961 Today's Date: 04/18/2017 Time: [id number] SLP Time Calculation (min) (ACUTE ONLY): 25 min  Assessment / Plan / Recommendation Clinical Impression  Pt sitting upright in chair, reports vomiting earlier today. He states TF rate was set too high at 70 and is now NPO.  SlP reviewed MBS with pt and importance of oral care due to aspiration risk of even secretions.    Pt consumed 2 tsps of pudding using chin tuck posture with min cues- delayed swallow followed by cough/expectoration of viscous secretions- concerning for airway penetration.   Pt demonstrated use of lingual press to roof of mouth for exercises.  Using teach back educated pt to findings/recommendations.  Provided pt paper to use to write questions/requests to staffing as needed.  Will follow up for dysphagia management.    HPI HPI: Cory Montoya a 56 y.o.malewith a past medical history of chronic respiratory failure, tracheostomy, PEG tube, DVT, diabetes, bipolar disorder who was at the [facility] and who was sent over here due to bizarre behavior over the last few days. Pt [facility] to [facility] from another facility where he presented from a prison in DKA. He had to be intubated. Could not be weaned off and had to have a tracheostomy tube placed. He also has a PEG tube. At the [facility] facility he was weaned off of his ventilator. He has been decannulated. 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 Has had multiple FEES with no improvement from therapeutic interventions. Has been evaluated by ENT. Reportedly he also drink from the faucet or the saline in the room. Pt noted to have left lingual deviation, he reports a history of stroke, but this is not in his chart.      SLP Plan  Continue  with current plan of care       Recommendations  Diet recommendations: NPO (x puree with SLP only)                Oral Care Recommendations: Oral care QID SLP Visit Diagnosis: Dysphagia, oropharyngeal phase (R13.12) Plan: Continue with current plan of care       GO                Cory Burnetamara Sawsan Riggio, MS [facility] SLP [phone]

## 2017-04-19 DIAGNOSIS — G894 Chronic pain syndrome: Secondary | ICD-10-CM

## 2017-04-19 LAB — GLUCOSE, CAPILLARY
GLUCOSE-CAPILLARY: 140 mg/dL — AB (ref 65–99)
GLUCOSE-CAPILLARY: 106 mg/dL — AB (ref 65–99)
Glucose-Capillary: 111 mg/dL — ABNORMAL HIGH (ref 65–99)
Glucose-Capillary: 113 mg/dL — ABNORMAL HIGH (ref 65–99)

## 2017-04-19 LAB — BASIC METABOLIC PANEL
ANION GAP: 7 (ref 5–15)
BUN: 9 mg/dL (ref 6–20)
CALCIUM: 8.4 mg/dL — AB (ref 8.9–10.3)
CHLORIDE: 105 mmol/L (ref 101–111)
CO2: 25 mmol/L (ref 22–32)
CREATININE: 0.39 mg/dL — AB (ref 0.61–1.24)
GFR calc non Af Amer: >60 mL/min (ref >60)
Glucose, Bld: 142 mg/dL — ABNORMAL HIGH (ref 65–99)
Potassium: 4.1 mmol/L (ref 3.5–5.1)
SODIUM: 137 mmol/L (ref 135–145)

## 2017-04-19 LAB — CBC
HEMATOCRIT: 29.4 % — AB (ref 39.0–52.0)
HEMOGLOBIN: 9.2 g/dL — AB (ref 13.0–17.0)
MCH: 27.2 pg (ref 26.0–34.0)
MCHC: 31.3 g/dL (ref 30.0–36.0)
MCV: 87 fL (ref 78.0–100.0)
Platelets: 315 10*3/uL (ref 150–400)
RBC: 3.38 MIL/uL — ABNORMAL LOW (ref 4.22–5.81)
RDW: 16.3 % — AB (ref 11.5–15.5)
WBC: 12.9 10*3/uL — AB (ref 4.0–10.5)

## 2017-04-19 LAB — MAGNESIUM: Magnesium: 1.8 mg/dL (ref 1.7–2.4)

## 2017-04-19 MED ORDER — SODIUM CHLORIDE 0.9 % IV SOLN
INTRAVENOUS | Status: DC
  Administered 2017-04-19 – 2017-04-20 (×2): via INTRAVENOUS

## 2017-04-19 MED ORDER — IPRATROPIUM-ALBUTEROL 0.5-2.5 (3) MG/3ML IN SOLN
3.0000 mL | Freq: Four times a day (QID) | RESPIRATORY_TRACT | Status: DC
  Administered 2017-04-19 – 2017-04-20 (×4): 3 mL via RESPIRATORY_TRACT
  Filled 2017-04-19 (×4): qty 3

## 2017-04-19 MED ORDER — IPRATROPIUM-ALBUTEROL 0.5-2.5 (3) MG/3ML IN SOLN: 3.0000 mL | Freq: Three times a day (TID) | RESPIRATORY_TRACT | Status: DC

## 2017-04-19 MED ORDER — JEVITY 1.2 CAL PO LIQD
1000.0000 mL | ORAL | Status: DC
  Administered 2017-04-19: 1000 mL
  Filled 2017-04-19: qty 1000

## 2017-04-19 MED ORDER — MORPHINE SULFATE (PF) 4 MG/ML IV SOLN
2.0000 mg | INTRAVENOUS | Status: AC | PRN
  Administered 2017-04-19 – 2017-04-20 (×5): 2 mg via INTRAVENOUS
  Filled 2017-04-19 (×6): qty 1

## 2017-04-19 NOTE — Progress Notes (Signed)
PROGRESS NOTE  Cory Montoya ZOX:[id number] DOB: 07/14/1961 DOA: 04/15/2017 PCP: System, Provider Not In  HPI/Recap of past 24 hours:  Tube feeds held yesterday due to vomiting, no more vomiting after hold tube feeds, no diarrhea, no fever,    He is hard to understand due to aphonia, he c/o chest pain, abdominal pain and leg pain which are chronic, he report he used to get chronic opioids prescription from [facility] [place] for many years until December 2017. Although [place] has [place] this [place]    Assessment/Plan: Principal Problem:   Aspiration pneumonia (HCC) Active Problems:   Bipolar affective disorder, manic, mild (HCC)   Tracheostomy present (HCC)   PEG (percutaneous endoscopic gastrostomy) status (HCC)   Chronic respiratory failure with hypoxia (HCC)   Normocytic anemia   Diabetes mellitus type 2 in nonobese (HCC)   Protein-calorie malnutrition, severe   Acute on chronic hypoxic respiratory failure likely due to aspiration pneumonia:  -I have reviewed careeverywhere extensively for past medical history, per care everywhere, patient was found down in prison and sent to local hospital intubated, [facility] was [facility] and later has to be trached and [facility] and [facility], [facility] the [facility],  [facility],  -[facility]  Due to behavior issues, He gets all feedings through his PEG tube. He is supposedly to be strict NPO, but per nursing staff at [facility], he still drinks off the faucet and drink any thing that he can get to , even drinks off from the saline bottle -he is found to have acute hypoxia and right lower lobe pneumonia --He required 6 literoxygen supplement initially to keep o2 sats at 90% --he is started on vancomycin and aztreonam since admission. vanc discontinued (mrsa screening negative, and this is likely aspiration penumonia), aztreonam changed to zosyn,  blood culture no growth so far, sputum culture pending recollection -developed hypoxia last night, repeat CT chest , pulm consult who recommend continue abx and outpatient ENT follow up due to aphonia ( possible vocal cord dysfunction another cause of aspiration of pneumonia)   Vomiting:  --CT ab/pel with iv contrast "Moderate descending, sigmoid and rectal wall thickening suggesting an inflammatory or infectious colitis." which is new compare to ct ab obtained on 6/13  --He does report central abdominal pain which is chronic, he denies left sided abdominal pain, denies pain with defecation, denies diarrhea -- tube feeds held on 6/16 due to vomiting, restart tube feeds on 6/17 , he report he has been on tube feeds 40cc/hr,  -he is on zosyn , consider testing for c diff if diarrhea, prn phenegran and prn morphine ordered for symptom control  Dysphagia: Secondary to tracheostomy. Speech therapy to see. He is on tube feedings. dietitian input apprecaited. Tube feedings with free water flush initiated. Check residuals. Continue Reglan   barium swallow shows "there is appearance of minimal esophageal persitalsis and after about 2 oz of solids, residuals increase due to esophageal backflow" He is on reglan   Bacteriuria:  urine culture 20.000 colonies of ecoli, patient denies urinary symptom  History of C. difficile:  No history of diarrhea recently. Looks like he was treated for C. difficile [facility]. Continue Probiotics.  Full thickness, Stage 3 tissue loss/pressure injury  to two areas; left buttock and left ischial tuberosity, presented on admission Wound type:Pressure plus moisture, shear Wound  care nurse will be consulted, input appreciated  History of DVT: Continue Xarelto.   Normocytic anemia: Baseline hemoglobin is not known. No evidence for overt bleeding. Continue to monitor hemoglobin. Check anemia panel   Diabetes mellitus type 2:  he report he has chronic  peripheral neuropathy from diabetes.  He is not noted to be on any long-term medications.  SSI. Continue neurontin HbA1c 5.6  Chronic pain: . He request fentanyl dose to be doubled, he request prn percocet and ultram, controlled substance data base did not show record of chronic pain meds in the past, I assume this meds was started either while he was hospitalized 6months ago or from the LTAC He is currently on fentanyl dose that he has been on at [facility], will continue current dose,  Prn percocet ordered per dose from controlled substance dase base  Continue prn ultram, today on 6/17, he states he does not want ultram, ultram is discontinued   He reports peg exit site pain:  He report he started to have this pain since after peg placement Ct ab "Feeding tube is in place with the tip in the stomach. The patient is status post gastric bypass without evidence complication. The colon appears normal. No evidence of appendicitis." No erythema or drainage on inspection.  He is hard to understand due to aphonia, he c/o chest pain, abdominal pain and leg pain which are chronic, he reports he used to get chronic opioids prescription from [facility] [place] for many years until December 2017. Although he has been in [place] for this years   History of bipolar disorder with recent bizarre behavior:  CT head did not show any acute findings. Continue Seroquel, valproic acid which was recently started from LT[facility]  He is also noted to be on gabapentin. 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   Prior h/o cva with left sided hemiparesis   Difficult social situation:  Daughter report his father has been in and out of their life for a long time, they donot know a lot of about him until he was in the hospital and intubated.  Social worker consulted  DVT Prophylaxis: On anticoagulation  Code Status: Full code  Family Communication: daughter Cory Montoya over  The phone at [phone] Disposition Plan : SNF vs home health   Consultants:  Speech/PT/OT/respiratory therapy  Procedures:  none  Antibiotics:  vanc /azteronam from admission to 6/14  Zosyn from 6/14   Objective: BP 114/61 (BP Location: Left Arm)   Pulse (!) 110   Temp 98.7 F (37.1 C) (Oral)   Resp 18   Ht 5\' 8"  (1.727 m)   Wt 82.3 kg (181 lb 7 oz)   SpO2 94%   BMI 27.59 kg/m   Intake/Output Summary (Last 24 hours) at 04/19/17 0739 Last data filed at 04/19/17 5621  Gross per 24 hour  Intake             1425 ml  Output              600 ml  Net              825 ml   Filed Weights   04/15/17 1156 04/17/17 0833 04/19/17 0544  Weight: 79.8 kg (176 lb) 83.5 kg (184 lb) 82.3 kg (181 lb 7 oz)    Exam:   General:  Frail, chronically ill appearing,  tracheostomy site healed but still aphonic   Cardiovascular: sinus tachycardia  Respiratory: diminished at right basis, crackles at right basis, some rhonchi, no wheezing, left lung clear, respiratory effort wnl  Abdomen:  Peg tube in place, Soft/ND/NT, positive BS  Musculoskeletal: No Edema  Neuro: aaox3, left arm with chronic contraction, left lower extremity weakness from prior cva Skin: Stage II decubiti noted in the buttock and sacral area, dressing in place  Data Reviewed: Basic Metabolic Panel:  Recent Labs Lab 04/15/17 1159 04/16/17 0819 04/17/17 0456 04/18/17 0410 04/19/17 0430  NA 139 141 138 138 137  K 4.4 3.7 3.5 3.8 4.1  CL 101 104 104 106 105  CO2 29 28 26 28 25   GLUCOSE 189* 129* 116* 172* 142*  BUN 28* 19 16 13 9   CREATININE 0.53* 0.40* 0.44* 0.33* 0.39*  CALCIUM 9.1 8.9 8.6* 8.4* 8.4*  MG  --   --   --  1.5* 1.8   Liver Function Tests:  Recent Labs Lab 04/15/17 1159 04/16/17 0819  AST 17 18  ALT 16* 13*  ALKPHOS 59 58  BILITOT 0.4 0.6  PROT 7.3 7.5  ALBUMIN 3.1* 3.3*   No results for input(s): LIPASE, AMYLASE in the last 168 hours. No results for  input(s): AMMONIA in the last 168 hours. CBC:  Recent Labs Lab 04/15/17 1159 04/16/17 0819 04/17/17 0456 04/18/17 0410 04/19/17 0430  WBC 16.7* 10.9* 8.6 8.2 12.9*  NEUTROABS 14.3*  --  5.0 4.2  --   HGB 9.8* 8.4* 8.8* 8.6* 9.2*  HCT 31.5* 26.5* 28.2* 27.3* 29.4*  MCV 85.8 86.9 85.7 86.9 87.0  PLT 297 321 264 254 315   Cardiac Enzymes:   No results for input(s): CKTOTAL, CKMB, CKMBINDEX, TROPONINI in the last 168 hours. BNP (last 3 results) No results for input(s): BNP in the last 8760 hours.  ProBNP (last 3 results) No results for input(s): PROBNP in the last 8760 hours.  CBG:  Recent Labs Lab 04/18/17 1133 04/18/17 1330 04/18/17 1648 04/18/17 2159 04/19/17 0735  GLUCAP 56* 105* 122* 134* 140*    Recent Results (from the past 240 hour(s))  Urine culture     Status: Abnormal   Collection Time: 04/15/17 11:34 AM  Result Value Ref Range Status   Specimen Description URINE, CLEAN CATCH  Final   Special Requests NONE  Final   Culture 20,000 COLONIES/mL ESCHERICHIA COLI (A)  Final   Report Status 04/17/2017 FINAL  Final   Organism ID, Bacteria ESCHERICHIA COLI (A)  Final      Susceptibility   Escherichia coli - MIC*    AMPICILLIN 16 INTERMEDIATE Intermediate     CEFAZOLIN <=4 SENSITIVE Sensitive     CEFTRIAXONE <=1 SENSITIVE Sensitive     CIPROFLOXACIN <=0.25 SENSITIVE Sensitive     GENTAMICIN <=1 SENSITIVE Sensitive     IMIPENEM <=0.25 SENSITIVE Sensitive     NITROFURANTOIN <=16 SENSITIVE Sensitive     TRIMETH/SULFA <=20 SENSITIVE Sensitive     AMPICILLIN/SULBACTAM 4 SENSITIVE Sensitive     PIP/TAZO <=4 SENSITIVE Sensitive     Extended ESBL NEGATIVE Sensitive     * 20,000 COLONIES/mL ESCHERICHIA COLI  Blood culture (routine x 2)     Status: None (Preliminary result)   Collection Time: 04/15/17 12:40 PM  Result Value Ref Range Status   Specimen Description BLOOD PICC LINE  Final   Special Requests   Final    BOTTLES DRAWN AEROBIC AND ANAEROBIC Blood Culture  adequate volume   Culture   Final    NO GROWTH 3 DAYS Performed at [facility], [address], [place], [place] [address]    Report Status PENDING  Incomplete  Blood culture (routine x 2)     Status: None (Preliminary result)   Collection Time: 04/15/17 12:40 PM  Result Value Ref Range Status   Specimen Description BLOOD RIGHT FOREARM  Final   Special Requests   Final    BOTTLES DRAWN AEROBIC AND ANAEROBIC Blood Culture adequate volume   Culture   Final    NO GROWTH 3 DAYS Performed at [facility], [address] [place], [place] [address]    Report Status PENDING  Incomplete  Culture, expectorated sputum-assessment     Status: None   Collection Time: 04/16/17 11:28 AM  Result Value Ref Range Status   Specimen Description EXPECTORATED SPUTUM  Final   Special Requests NONE  Final   Sputum evaluation   Final    Sputum specimen not acceptable for testing.  Please recollect.   Gram Stain Report Called to,Read Back By and Verified With: D LOCK,RN @1238  04/16/17 MKELLY    Report Status 04/16/2017 FINAL  Final  MRSA PCR Screening     Status: None   Collection Time: 04/16/17 11:30 AM  Result Value Ref Range Status   MRSA by PCR NEGATIVE NEGATIVE Final    Comment:        The GeneXpert MRSA Assay (FDA approved for NASAL specimens only), is one component of a comprehensive MRSA colonization surveillance program. It is not intended to diagnose MRSA infection nor to guide or monitor treatment for MRSA infections.      Studies: Ct Chest W Contrast  Result Date: 04/18/2017 CLINICAL DATA:  Pneumonia and abdominal pain. EXAM: CT CHEST, ABDOMEN, AND PELVIS WITH CONTRAST TECHNIQUE: Multidetector CT imaging of the chest, abdomen and pelvis was performed following the standard protocol during bolus administration of intravenous contrast. CONTRAST:  ISOVUE-300 IOPAMIDOL (ISOVUE-300) INJECTION 61% COMPARISON:  CT scan 04/15/2017 FINDINGS: CT CHEST FINDINGS  Cardiovascular: The heart is normal in size. No pericardial effusion. Mild tortuosity of the thoracic aorta but no dissection or focal aneurysm. No significant atherosclerotic calcifications. The branch vessels are patent. No definite coronary artery calcifications. A right-sided PICC line is noted. Mediastinum/Nodes: Small scattered mediastinal and hilar lymph nodes are stable. These are likely reactive/inflammatory. The esophagus is grossly normal. Lungs/Pleura: Persistent but improved bilateral patchy ground-glass opacities likely clearing/ improving infiltrates. Persistent airspace consolidation/focal pneumonia in the right lower lobe with air bronchograms, and not significantly changed. No pleural effusion. No worrisome pulmonary lesions. Stable marked elevation of the right hemidiaphragm. Chest wall/ Musculoskeletal: No chest wall mass, supraclavicular or axillary lymphadenopathy. Small scattered lymph nodes are noted. The thyroid gland is grossly normal. Surgical changes involving the anterior upper tracheal region most likely due to a prior tracheostomy. No significant bony findings. CT ABDOMEN PELVIS FINDINGS Hepatobiliary: Stable mild intrahepatic biliary dilatation likely due to prior cholecystectomy. No focal hepatic lesions. No common bile duct dilatation. Pancreas: Significant atrophy of the pancreas. No obvious mass or acute inflammation. Spleen: Normal size.  No focal lesions. Adrenals/Urinary Tract: The adrenal glands and kidneys are unremarkable and stable. Stomach/Bowel: Surgical changes involving the stomach. A feeding gastrostomy tube is an good position without complicating features. The duodenum is grossly normal. No small bowel dilatation to suggest obstruction. Moderate contrast throughout the colon is noted. There is moderate wall thickening involving the descending colon, sigmoid colon and rectum suggesting an inflammatory or infectious colitis. The ascending and transverse colon are  unremarkable.  Vascular/Lymphatic: Scattered atherosclerotic calcifications involving the aorta and iliac arteries but no aneurysm or dissection. The branch vessels are patent. The major venous structures are patent. No mesenteric or retroperitoneal mass or adenopathy. Small scattered lymph nodes are stable. Reproductive: The prostate gland and seminal vesicles are unremarkable. Other: No pelvic mass or adenopathy. No free pelvic fluid collections. No inguinal mass or adenopathy. No abdominal wall hernia or subcutaneous lesions. Musculoskeletal: No significant bony findings. IMPRESSION: 1. Persistent but slightly improved multiple bilateral patchy airspace opacities, likely improving infiltrates. 2. Persistent dense airspace consolidation in the right lower lobe consistent with pneumonia. 3. No mediastinal or hilar mass or adenopathy. Small scattered lymph nodes are stable. 4. Status post cholecystectomy with stable mild intrahepatic biliary dilatation. 5. Stable surgical changes involving the GE junction and stomach. Feeding gastrostomy tube is in good position without complicating features. 6. Moderate descending, sigmoid and rectal wall thickening suggesting an inflammatory or infectious colitis. Electronically Signed   By: Rudie Meyer M.D.   On: 04/18/2017 11:24   Ct Abdomen Pelvis W Contrast  Result Date: 04/18/2017 CLINICAL DATA:  Pneumonia and abdominal pain. EXAM: CT CHEST, ABDOMEN, AND PELVIS WITH CONTRAST TECHNIQUE: Multidetector CT imaging of the chest, abdomen and pelvis was performed following the standard protocol during bolus administration of intravenous contrast. CONTRAST:  ISOVUE-300 IOPAMIDOL (ISOVUE-300) INJECTION 61% COMPARISON:  CT scan 04/15/2017 FINDINGS: CT CHEST FINDINGS Cardiovascular: The heart is normal in size. No pericardial effusion. Mild tortuosity of the thoracic aorta but no dissection or focal aneurysm. No significant atherosclerotic calcifications. The branch vessels  are patent. No definite coronary artery calcifications. A right-sided PICC line is noted. Mediastinum/Nodes: Small scattered mediastinal and hilar lymph nodes are stable. These are likely reactive/inflammatory. The esophagus is grossly normal. Lungs/Pleura: Persistent but improved bilateral patchy ground-glass opacities likely clearing/ improving infiltrates. Persistent airspace consolidation/focal pneumonia in the right lower lobe with air bronchograms, and not significantly changed. No pleural effusion. No worrisome pulmonary lesions. Stable marked elevation of the right hemidiaphragm. Chest wall/ Musculoskeletal: No chest wall mass, supraclavicular or axillary lymphadenopathy. Small scattered lymph nodes are noted. The thyroid gland is grossly normal. Surgical changes involving the anterior upper tracheal region most likely due to a prior tracheostomy. No significant bony findings. CT ABDOMEN PELVIS FINDINGS Hepatobiliary: Stable mild intrahepatic biliary dilatation likely due to prior cholecystectomy. No focal hepatic lesions. No common bile duct dilatation. Pancreas: Significant atrophy of the pancreas. No obvious mass or acute inflammation. Spleen: Normal size.  No focal lesions. Adrenals/Urinary Tract: The adrenal glands and kidneys are unremarkable and stable. Stomach/Bowel: Surgical changes involving the stomach. A feeding gastrostomy tube is an good position without complicating features. The duodenum is grossly normal. No small bowel dilatation to suggest obstruction. Moderate contrast throughout the colon is noted. There is moderate wall thickening involving the descending colon, sigmoid colon and rectum suggesting an inflammatory or infectious colitis. The ascending and transverse colon are unremarkable. Vascular/Lymphatic: Scattered atherosclerotic calcifications involving the aorta and iliac arteries but no aneurysm or dissection. The branch vessels are patent. The major venous structures are patent.  No mesenteric or retroperitoneal mass or adenopathy. Small scattered lymph nodes are stable. Reproductive: The prostate gland and seminal vesicles are unremarkable. Other: No pelvic mass or adenopathy. No free pelvic fluid collections. No inguinal mass or adenopathy. No abdominal wall hernia or subcutaneous lesions. Musculoskeletal: No significant bony findings. IMPRESSION: 1. Persistent but slightly improved multiple bilateral patchy airspace opacities, likely improving infiltrates. 2. Persistent dense airspace consolidation in the  right lower lobe consistent with pneumonia. 3. No mediastinal or hilar mass or adenopathy. Small scattered lymph nodes are stable. 4. Status post cholecystectomy with stable mild intrahepatic biliary dilatation. 5. Stable surgical changes involving the GE junction and stomach. Feeding gastrostomy tube is in good position without complicating features. 6. Moderate descending, sigmoid and rectal wall thickening suggesting an inflammatory or infectious colitis. Electronically Signed   By: Rudie Meyer M.D.   On: 04/18/2017 11:24    Scheduled Meds: . clonazePAM  0.5 mg Per Tube Q8H  . famotidine  20 mg Per Tube Q12H  . fentaNYL  12.5 mcg Transdermal Q72H  . free water  200 mL Per Tube Q6H  . gabapentin  300 mg Per Tube Q8H  . insulin aspart  0-15 Units Subcutaneous TID WC  . ipratropium-albuterol  3 mL Nebulization BID  . metoCLOPramide  5 mg Per Tube Q8H  . multivitamin with minerals  1 tablet Oral Q breakfast  . pantoprazole sodium  40 mg Per Tube QAC breakfast  . QUEtiapine  50 mg Per Tube Q12H  . rivaroxaban  20 mg Oral Q breakfast  . saccharomyces boulardii  250 mg Per Tube BID  . sertraline  100 mg Per Tube QAC breakfast  . Valproate Sodium  500 mg Per Tube Q12H    Continuous Infusions: . dextrose 5 % and 0.9% NaCl 75 mL/hr at 04/19/17 0246  . feeding supplement (JEVITY 1.2 CAL) Stopped (04/18/17 1403)  . piperacillin-tazobactam (ZOSYN)  IV 3.375 g (04/19/17  7829)     Time spent:  Rihaan Barrack MD, PhD  [facility] Pager [phone]. If 7PM-7AM, please contact night-coverage at [email or web address], password [facility] 04/19/2017, 7:39 AM  LOS: 4 days

## 2017-04-20 DIAGNOSIS — R531 Weakness: Secondary | ICD-10-CM

## 2017-04-20 DIAGNOSIS — E43 Unspecified severe protein-calorie malnutrition: Secondary | ICD-10-CM

## 2017-04-20 LAB — CBC WITH DIFFERENTIAL/PLATELET
Basophils Absolute: 0 10*3/uL (ref 0.0–0.1)
Basophils Relative: 0 %
Eosinophils Absolute: 0.5 10*3/uL (ref 0.0–0.7)
Eosinophils Relative: 5 %
HEMATOCRIT: 28.9 % — AB (ref 39.0–52.0)
Hemoglobin: 9 g/dL — ABNORMAL LOW (ref 13.0–17.0)
LYMPHS ABS: 2.8 10*3/uL (ref 0.7–4.0)
Lymphocytes Relative: 29 %
MCH: 27.4 pg (ref 26.0–34.0)
MCHC: 31.1 g/dL (ref 30.0–36.0)
MCV: 87.8 fL (ref 78.0–100.0)
Monocytes Absolute: 0.5 10*3/uL (ref 0.1–1.0)
Monocytes Relative: 5 %
NEUTROS PCT: 61 %
Neutro Abs: 5.8 10*3/uL (ref 1.7–7.7)
Platelets: 310 10*3/uL (ref 150–400)
RBC: 3.29 MIL/uL — ABNORMAL LOW (ref 4.22–5.81)
RDW: 16.3 % — ABNORMAL HIGH (ref 11.5–15.5)
WBC: 9.6 10*3/uL (ref 4.0–10.5)

## 2017-04-20 LAB — BASIC METABOLIC PANEL
Anion gap: 5 (ref 5–15)
BUN: 8 mg/dL (ref 6–20)
CALCIUM: 8.5 mg/dL — AB (ref 8.9–10.3)
CO2: 27 mmol/L (ref 22–32)
CREATININE: 0.42 mg/dL — AB (ref 0.61–1.24)
Chloride: 107 mmol/L (ref 101–111)
GFR calc Af Amer: >60 mL/min (ref >60)
GFR calc non Af Amer: >60 mL/min (ref >60)
GLUCOSE: 125 mg/dL — AB (ref 65–99)
Potassium: 3.7 mmol/L (ref 3.5–5.1)
Sodium: 139 mmol/L (ref 135–145)

## 2017-04-20 LAB — GLUCOSE, CAPILLARY
Glucose-Capillary: 108 mg/dL — ABNORMAL HIGH (ref 65–99)
Glucose-Capillary: 80 mg/dL (ref 65–99)

## 2017-04-20 LAB — TSH: TSH: 1.002 u[IU]/mL (ref 0.350–4.500)

## 2017-04-20 LAB — MAGNESIUM: Magnesium: 1.7 mg/dL (ref 1.7–2.4)

## 2017-04-20 LAB — CULTURE, BLOOD (ROUTINE X 2)
Culture: NO GROWTH
Culture: NO GROWTH
SPECIAL REQUESTS: ADEQUATE
SPECIAL REQUESTS: ADEQUATE

## 2017-04-20 MED ORDER — AMOXICILLIN-POT CLAVULANATE 875-125 MG PO TABS: 1.0000 | ORAL_TABLET | Freq: Two times a day (BID) | ORAL | Status: DC

## 2017-04-20 MED ORDER — FUROSEMIDE 20 MG PO TABS: 20.0000 mg | ORAL_TABLET | Freq: Every day | ORAL | 0 refills | Status: DC | PRN

## 2017-04-20 MED ORDER — SACCHAROMYCES BOULARDII 250 MG PO CAPS
250.0000 mg | ORAL_CAPSULE | Freq: Two times a day (BID) | ORAL | 0 refills | Status: AC
Start: 2017-04-20 — End: ?

## 2017-04-20 MED ORDER — FUROSEMIDE 20 MG PO TABS: 20.0000 mg | ORAL_TABLET | Freq: Every day | ORAL | 0 refills | Status: AC | PRN

## 2017-04-20 MED ORDER — OXYCODONE-ACETAMINOPHEN 5-325 MG PO TABS
1.0000 | ORAL_TABLET | Freq: Once | ORAL | Status: AC
  Administered 2017-04-20: 1 via ORAL
  Filled 2017-04-20: qty 1

## 2017-04-20 MED ORDER — FREE WATER: 200.0000 mL | Freq: Four times a day (QID) | 10 refills | Status: DC

## 2017-04-20 MED ORDER — PREGABALIN 50 MG PO CAPS: 50.0000 mg | ORAL_CAPSULE | Freq: Three times a day (TID) | ORAL | 0 refills | Status: DC

## 2017-04-20 MED ORDER — PREGABALIN 50 MG PO CAPS: 50.0000 mg | ORAL_CAPSULE | Freq: Three times a day (TID) | ORAL | Status: DC

## 2017-04-20 MED ORDER — AMOXICILLIN-POT CLAVULANATE 875-125 MG PO TABS: 1.0000 | ORAL_TABLET | Freq: Two times a day (BID) | ORAL | 0 refills | Status: DC

## 2017-04-20 MED ORDER — AMOXICILLIN-POT CLAVULANATE 875-125 MG PO TABS: 1.0000 | ORAL_TABLET | Freq: Two times a day (BID) | ORAL | 0 refills | Status: AC

## 2017-04-20 MED ORDER — GLUCERNA 1.5 CAL PO LIQD: ORAL | 10 refills | Status: AC

## 2017-04-20 MED ORDER — FREE WATER: 200.0000 mL | Freq: Four times a day (QID) | 10 refills | Status: AC

## 2017-04-20 MED ORDER — JEVITY 1.2 CAL PO LIQD
1000.0000 mL | ORAL | Status: DC
  Filled 2017-04-20: qty 1000

## 2017-04-20 MED ORDER — PROMETHAZINE HCL 25 MG PO TABS: 25.0000 mg | ORAL_TABLET | Freq: Three times a day (TID) | ORAL | 0 refills | Status: AC | PRN

## 2017-04-20 MED ORDER — SACCHAROMYCES BOULARDII 250 MG PO CAPS: 250.0000 mg | ORAL_CAPSULE | Freq: Two times a day (BID) | ORAL | 0 refills | Status: DC

## 2017-04-20 MED ORDER — GLUCERNA 1.5 CAL PO LIQD: ORAL | 10 refills | Status: DC

## 2017-04-20 MED ORDER — PREGABALIN 50 MG PO CAPS: 50.0000 mg | ORAL_CAPSULE | Freq: Three times a day (TID) | ORAL | 0 refills | Status: AC

## 2017-04-20 NOTE — Progress Notes (Signed)
Discharge instructions given to patient's family. No questions or concerns at this time.

## 2017-04-20 NOTE — Discharge Summary (Signed)
Discharge Summary  Cory Montoya JYN:[id number] DOB: July 15, 1961  PCP: System, Provider Not In  Admit date: 04/15/2017 Discharge date: 04/20/2017  Time spent: >50mins, more than 50% time spent on coordination of care.  Recommendations for Outpatient Follow-up:  1. F/u with PMD on 6/19 at 3pm, with [facility] [place] , in [place] with Carren Rang, repeat cbc/bmp at follow up. pmd to refer patient to pain clinic for chronic pain if needed 2. F/u with ENT in one month for vocal cord dysfunction 3. F/u with GI for GI in one month for GI dysmotility   Discharge Diagnoses:  Active Hospital Problems   Diagnosis Date Noted  . Aspiration pneumonia (HCC) 04/15/2017  . Protein-calorie malnutrition, severe 04/16/2017  . Bipolar affective disorder, manic, mild (HCC) 04/15/2017  . Tracheostomy present (HCC) 04/15/2017  . PEG (percutaneous endoscopic gastrostomy) status (HCC) 04/15/2017  . Chronic respiratory failure with hypoxia (HCC) 04/15/2017  . Normocytic anemia 04/15/2017  . Diabetes mellitus type 2 in nonobese [facility]) 04/15/2017    Resolved Hospital Problems   Diagnosis Date Noted Date Resolved  No resolved problems to display.    Discharge Condition: stable  Diet recommendation: strict NPO, meds and nutrition per tube   tube feeds changed to 25cc/hr continuously at discharge with glucerna, can slowly increase tube feeds to 40cc/hr in the next 1-2 weeks, check residual q6hrs, hold if residual >60cc. Free water flush 200cc q6hrs  Continue speech eval,   Filed Weights   04/15/17 1156 04/17/17 0833 04/19/17 0544  Weight: 79.8 kg (176 lb) 83.5 kg (184 lb) 82.3 kg (181 lb 7 oz)    History of present illness:  PCP: Unknown Specialists: Unknown  Chief Complaint: Bizarre behavior at [facility]  HPI: Cory Montoya is a 56 y.o. male with a past medical history of chronic respiratory failure, tracheostomy, PEG tube, DVT, diabetes, bipolar disorder who was at the [facility]  long-term [facility] facility and who was sent over here due to bizarre behavior over the last few days. Patient has apparently been more combative and was making sexual comments to the staff over in that facility. Patient's dose of Seroquel was increased. He was also placed on valproic acid. Apparently, patient was on IV antibiotics for pneumonia and finished a course 2-3 weeks ago. He still has a PICC line. He also has a history of C. difficile, which has been treated previously. Most of this information is obtained from nursing staff as well as notes. No family is at bedside. Patient unable to provide much of a history. He appears to be quite emotionally labile. Starts crying. It appears that patient was admitted to [facility] from another facility [facility] a [facility]. He had to be intubated. Could not be weaned off and had to have a tracheostomy tube placed. He also has a PEG tube. At the [facility] facility he was weaned off of his ventilator. He is currently on trach collar. Patient complains of pain in his buttock area where he has wounds.  In the emergency department, patient was evaluated. He was noted to be borderline hypotensive and was noted to have a low-grade fever and was mildly tachycardic. CT scan of the chest, abdomen, pelvis was done and it revealed evidence for pneumonia right more than left. CT scan of the head did not show any acute findings. He was noted to have elevated WBC. No diarrhea has been reported.  Home Medications:        Prior to Admission medications  Medication Sig Start Date End Date Taking? Authorizing Provider  acetaminophen (TYLENOL) 325 MG tablet Take 650 mg by mouth every 8 (eight) hours as needed (for pain).   Yes [provider]  alum & mag hydroxide-simeth (MAALOX PLUS) 400-400-40 MG/5ML suspension Take 30 mLs by mouth every 6 (six) hours as needed for indigestion.   Yes [provider]  chlorhexidine (PERIDEX) 0.12 % solution  Use as directed 15 mLs in the mouth or throat 2 (two) times daily. Started 03/30 finish on 06/13   Yes [provider]  clonazePAM (KLONOPIN) 0.5 MG tablet Place 0.5 mg into feeding tube every 8 (eight) hours.   Yes [provider]  diphenhydrAMINE (BENADRYL) 25 MG tablet Take 25 mg by mouth every 8 (eight) hours as needed for itching.   Yes [provider]  divalproex (DEPAKOTE) 500 MG DR tablet Take 500 mg by mouth every 12 (twelve) hours.   Yes [provider]  famotidine (PEPCID) 20 MG tablet Place 20 mg into feeding tube every 12 (twelve) hours.   Yes [provider]  fentaNYL (DURAGESIC - DOSED MCG/HR) 12 MCG/HR Place 12 mcg onto the skin every 3 (three) days.   Yes [provider]  furosemide (LASIX) 20 MG tablet Place 20 mg into feeding tube daily with breakfast.   Yes [provider]  gabapentin (NEURONTIN) 300 MG capsule Place 300 mg into feeding tube every 8 (eight) hours.   Yes [provider]  insulin lispro (HUMALOG KWIKPEN) 100 UNIT/ML KiwkPen Inject 2-10 Units into the skin 4 (four) times daily -  before meals and at bedtime. Per Sliding Scale: If BS 151-200 = 2 units, 201-250 = 4 units, 251-300 = 6 units, 301-350 = 8 units, 351-400 = 10 units, Over 400 call MD   Yes [provider]  ipratropium-albuterol (DUONEB) 0.5-2.5 (3) MG/3ML SOLN Take 3 mLs by nebulization every 6 (six) hours.   Yes [provider]  loperamide (IMODIUM A-D) 2 MG tablet Take 2 mg by mouth as needed for diarrhea or loose stools.   Yes [provider]  metoCLOPramide (REGLAN) 5 MG tablet Place 5 mg into feeding tube every 8 (eight) hours.   Yes [provider]  metoprolol succinate (TOPROL-XL) 25 MG 24 hr tablet Take 12.5 mg by mouth every 12 (twelve) hours.   Yes [provider]  Multiple Vitamin (MULTIVITAMIN WITH MINERALS) TABS tablet Take 1 tablet by mouth daily with  breakfast.   Yes [provider]  Nutritional Supplements (FEEDING SUPPLEMENT, GLUCERNA 1.5 CAL,) LIQD Place 180 mL/hr into feeding tube every 4 (four) hours.   Yes [provider]  oxyCODONE-acetaminophen (PERCOCET/ROXICET) 5-325 MG tablet Take 1 tablet by mouth every 12 (twelve) hours as needed (for pain).   Yes [provider]  OXYGEN Inhale into the lungs continuous as needed (for shortness of breath).   Yes [provider]  pantoprazole sodium (PROTONIX) 40 mg/20 mL PACK Place 40 mg into feeding tube daily before breakfast.   Yes [provider]  promethazine (PHENERGAN) 25 MG tablet Take 25 mg by mouth every 8 (eight) hours as needed for nausea or vomiting.   Yes [provider]  Protein POWD Take 1 scoop by mouth every 6 (six) hours.   Yes [provider]  QUEtiapine (SEROQUEL) 50 MG tablet Place 50 mg into feeding tube every 12 (twelve) hours.   Yes [provider]  rivaroxaban (XARELTO) 20 MG TABS tablet Take 20 mg by mouth  daily with breakfast.   Yes [provider]  sertraline (ZOLOFT) 100 MG tablet Place 100 mg into feeding tube daily before breakfast.   Yes [provider]  sodium chloride 0.9 % injection Inject 10 mLs into the vein 2 (two) times daily. Every shift   Yes [provider]  Sodium Chloride, Inhalant, 7 % NEBU 1 vial by Intratracheal route every 6 (six) hours.   Yes [provider]  traMADol (ULTRAM) 50 MG tablet Place 50 mg into feeding tube every 8 (eight) hours.   Yes [provider]  Valproate Sodium (VALPROIC ACID) 250 MG/5ML SOLN Place 500 mg into feeding tube every 12 (twelve) hours.   Yes [provider]    Allergies:       Allergies  Allergen Reactions  . Codeine     Unknown reaction per MAR   . Keflex [Cephalexin]     Unknown reaction per MAR   . Ketorolac     Unknown reaction per [facility] Course:  Principal Problem:   Aspiration pneumonia (HCC) Active Problems:   Bipolar affective disorder, manic, mild (HCC)   Tracheostomy present (HCC)   PEG (percutaneous endoscopic gastrostomy) status (HCC)   Chronic respiratory failure with hypoxia (HCC)   Normocytic anemia   Diabetes mellitus type 2 in nonobese (HCC)   Protein-calorie malnutrition, severe   Acute on chronic hypoxic respiratory failure likely due to aspiration pneumonia:  -I have reviewed careeverywhere extensively for past medical history, per care everywhere, patient was found down in prison and sent to local hospital intubated, he was [facility] and later has to be trached and pegged and discharged to [facility], he has been at the [facility] for the last 6months,  trach removed two days ago at [facility]  Prior to admitted to [facility] long hospital.  -patient is sent from LTAC to [facility] long hospital due to behavior issues, He gets all feedings through his PEG tube. He is supposedly to be strict NPO, but per nursing staff at [facility], he still drinks off the faucet and drink any thing that he can get to , even drinks off from the saline bottle -he is found to have acute hypoxia and right lower lobe pneumonia on admission ( he was treated for aspiration pneumonia a few weeks ago at [facility]) --He required 6 literoxygen supplement initially to keep o2 sats at 90%, he has improved, now back to baseline 2liters 24/7. --CT chest with right lower lobe pneumonia , pulm consult who recommend continue abx and outpatient ENT follow up due to aphonia ( possible vocal cord dysfunction another cause of aspiration of pneumonia) - He is started on vancomycin and aztreonam since admission. vanc discontinued (mrsa screening negative, and this is likely aspiration penumonia), aztreonam changed to zosyn, blood culture no growth so far, sputum culture not acceptable for testing, he has improved, no cough at discharge, he is discharged on  augmentin to home to finish abx treatment.   Vomiting:  --CT ab/pel with iv contrast "Moderate descending, sigmoid and rectal wall thickening suggesting an inflammatory or infectious colitis." which is new compare to ct ab obtained on 6/13  --He does report central abdominal pain which is chronic, he denies left sided abdominal pain, denies pain with defecation, denies diarrhea -- tube feeds held on 6/16 due to vomiting, restart tube feeds on 6/17 , he report he has been on tube feeds 40cc/hr,  -he is on zosyn , consider testing for  c diff if diarrhea, prn phenegran and prn morphine ordered for symptom control - he only vomited once in the hospital, no more vomiting after tube feeds rate adjusted, however, he seems still has high residual , tube feeds changed to 25cc/hr continuously at discharge with glucerna, can slowly increase tube feeds to 40cc/hr in the next 1-2 weeks, check residual q6hrs, hold if residual >60cc.  Aphonia: -need to follow up with ENT  Dysphagia: Secondary to tracheostomy and esophageal dysmotilit. Continue Reglan  barium swallow shows "there is appearance of minimal esophageal persitalsis and after about 2 oz of solids, residuals increase due to esophageal backflow" Continue home health speech eval, follow up with GI for Gi dysmotility.   Bacteriuria:  urine culture 20.000 colonies of ecoli, patient denies urinary symptom, he received full course of abx for aspiration pna.  History of C. difficile:  Looks like he was treated for C. difficile while he was at [facility]. No  diarrhea in the hospital. Continue Probiotics.  Full thickness, Stage 3 tissue loss/pressure injury to two areas; left buttock and left ischial tuberosity, presented on admission Wound type:Pressure plus moisture, shear Wound care nurse will be consulted, input appreciated  History of DVT: Continue Xarelto.   Normocytic anemia:  Baseline hemoglobin is not known. No evidence for overt  bleeding.  Hemoglobin stable around 9 in the hospital. B12/folate wnl, iron penal with low iron saturation, but decreased TIBC.    Insulin dependent Diabetes mellitus type 2: HbA1c 5.6 he report he has chronic peripheral neuropathy from diabetes.  Home dose sliding scale continued.  He prefers lyrica rather than neurontin. neurontin discontinued, lyrica prescribed, dose titration per pmd.   Chronic pain:   He request fentanyl dose to be doubled, he request prn percocet and ultram, controlled substance data base did not show record of chronic pain meds in the past,  I assume this meds was started either while he was hospitalized 6months ago or from the LTAC  He is currently on fentanyl dose that he has been on at [facility], will continue current dose,   Prn percocet ordered per dose from controlled substance dase base   on 6/17, he states he does not want ultram, ultram is discontinued   He reports peg exit site pain:  He report he started to have this pain since after peg placement Ct ab "Feeding tube is in place with the tip in the stomach. The patient is status post gastric bypass without evidence complication. The colon appears normal. No evidence of appendicitis." No erythema or drainage on inspection.  He is hard to understand due to aphonia, he c/o chest pain, abdominal pain and leg pain which are chronic, he reports he used to get chronic opioids prescription from [facility] [place] for many years until December 2017. Although he has been in [place] for these years   History of bipolar disorder with recent bizarre behavior:  CT head did not show any acute findings. 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   Prior h/o cva with left sided hemiparesis   Severe malnutrition in context of chronic illness Nutrition follow up   DVT Prophylaxis:On  anticoagulation  Code Status:DNR , verified with patient with the RN and pharmacist in room Family Communication:daughter [name] over  The phone at [phone] Disposition Plan : home with home health   Consultants:  Speech/PT/OT/respiratory therapy  Procedures:  none  Antibiotics:  vanc /azteronam from admission to 6/14  Zosyn from 6/14   Discharge Exam: BP 115/78 (BP Location: Left Arm)   Pulse 98   Temp 97.8 F (36.6 C) (Oral)   Resp 18   Ht 5\' 8"  (1.727 m)   Wt 82.3 kg (181 lb 7 oz)   SpO2 91%   BMI 27.59 kg/m    General:  Frail, chronically ill appearing, tracheostomy site healed but still aphonic   Cardiovascular: sinus tachycardia has resolved  Respiratory: diminished at right basis, crackles at right basis, some rhonchi, no wheezing, left lung clear, respiratory effort wnl  Abdomen:  Peg tube in place, Soft/ND/NT, positive BS  Musculoskeletal: No Edema  Neuro: aaox3, left arm with chronic contraction, left lower extremity weakness from prior cva Skin: Stage II decubiti noted in the buttock and sacral area, dressing in place   Discharge Instructions You were cared for by a hospitalist during your hospital stay. If you have any questions about your discharge medications or the care you received while you were in the hospital after you are discharged, you can call the unit and asked to speak with the hospitalist on call if the hospitalist that took care of you is not available. Once you are discharged, your primary care physician will handle any further medical issues. Please note that NO REFILLS for any discharge medications will be authorized once you are discharged, as it is imperative that you return to your primary care physician (or establish a relationship with a primary care physician if you do not have one) for your aftercare needs so that they can reassess your need for medications and monitor your lab values.  Discharge Instructions      Cane    Complete by:  As directed    triangle based cane x1   Discharge instructions    Complete by:  As directed    Strict NPO for now, nutrition and meds through tube, continue speech eval, follow up with ENT for vocal cord dysfunction, follow up with GI for Gi dysmotility issues.   Face-to-face encounter (required for Medicare/Medicaid patients)    Complete by:  As directed    I Donell Sliwinski certify that this patient is under my care and that I, or a nurse practitioner or physician's assistant working with me, had a face-to-face encounter that meets the physician face-to-face encounter requirements with this patient on 04/20/2017. The encounter with the patient was in whole, or in part for the following medical condition(s) which is the primary reason for home health care (List medical condition): FTT, recurrent aspiration pneumonia, continue tube feeds, continue speech therapy eval.   The encounter with the patient was in whole, or in part, for the following medical condition, which is the primary reason for home health care:  FTT   I certify that, based on my findings, the following services are medically necessary home health services:   Nursing Speech language pathology     Reason for Medically Necessary Home Health Services:  Skilled Nursing- Change/Decline in Patient Status   My clinical findings support the need for the above services:  Shortness of breath with activity   Further, I certify that my clinical findings support that this patient is homebound due to:  Shortness of Breath with activity   Home Health    Complete by:  As directed    To provide the following care/treatments:   SLP RN Home Health Aide Social work OT     Increase activity slowly  Complete by:  As directed    Increase activity slowly    Complete by:  As directed      Allergies as of 04/20/2017      Reactions   Codeine    Unknown reaction per MAR    Keflex [cephalexin]    Unknown reaction per MAR     Ketorolac    Unknown reaction per [facility]       Medication List    STOP taking these medications   alum & mag hydroxide-simeth 400-400-40 MG/5ML suspension Commonly known as:  MAALOX PLUS   chlorhexidine 0.12 % solution Commonly known as:  PERIDEX   divalproex 500 MG DR tablet Commonly known as:  DEPAKOTE   gabapentin 300 MG capsule Commonly known as:  NEURONTIN   loperamide 2 MG tablet Commonly known as:  IMODIUM A-D   metoprolol succinate 25 MG 24 hr tablet Commonly known as:  TOPROL-XL   oxyCODONE-acetaminophen 5-325 MG tablet Commonly known as:  PERCOCET/ROXICET   Protein Powd   sodium chloride 0.9 % injection   traMADol 50 MG tablet Commonly known as:  ULTRAM     TAKE these medications   acetaminophen 325 MG tablet Commonly known as:  TYLENOL Take 650 mg by mouth every 8 (eight) hours as needed (for pain).   amoxicillin-clavulanate 875-125 MG tablet Commonly known as:  AUGMENTIN Place 1 tablet into feeding tube every 12 (twelve) hours.   clonazePAM 0.5 MG tablet Commonly known as:  KLONOPIN Place 0.5 mg into feeding tube every 8 (eight) hours.   diphenhydrAMINE 25 MG tablet Commonly known as:  BENADRYL Take 25 mg by mouth every 8 (eight) hours as needed for itching.   famotidine 20 MG tablet Commonly known as:  PEPCID Place 20 mg into feeding tube every 12 (twelve) hours.   feeding supplement (GLUCERNA 1.5 CAL) Liqd 25cc/hr continuously, check residual q6hrs, hold tube feeds if residual >80cc. Can increase tube feeds to up to 40cc/hr if patient is able to tolerate and residual continue to be less than 80cc in 1-2 weeks, further instruction regarding tube feed per primary care physician. What changed:  how much to take  how to take this  when to take this  additional instructions   fentaNYL 12 MCG/HR Commonly known as:  DURAGESIC - dosed mcg/hr Place 12 mcg onto the skin every 3 (three) days.   free water Soln Place 200 mLs into feeding tube  every 6 (six) hours.   furosemide 20 MG tablet Commonly known as:  LASIX Place 1 tablet (20 mg total) into feeding tube daily as needed for fluid or edema. What changed:  when to take this  reasons to take this   HUMALOG KWIKPEN 100 UNIT/ML KiwkPen Generic drug:  insulin lispro Inject 2-10 Units into the skin 4 (four) times daily -  before meals and at bedtime. Per Sliding Scale: If BS 151-200 = 2 units, 201-250 = 4 units, 251-300 = 6 units, 301-350 = 8 units, 351-400 = 10 units, Over 400 call MD   ipratropium-albuterol 0.5-2.5 (3) MG/3ML Soln Commonly known as:  DUONEB Take 3 mLs by nebulization every 6 (six) hours.   metoCLOPramide 5 MG tablet Commonly known as:  REGLAN Place 5 mg into feeding tube every 8 (eight) hours.   multivitamin with minerals Tabs tablet Take 1 tablet by mouth daily with breakfast.   OXYGEN Inhale into the lungs continuous as needed (for shortness of breath).   pregabalin 50 MG capsule Commonly known as:  LYRICA  Take 1 capsule (50 mg total) by mouth 3 (three) times daily.   promethazine 25 MG tablet Commonly known as:  PHENERGAN Place 1 tablet (25 mg total) into feeding tube every 8 (eight) hours as needed for nausea or vomiting. What changed:  how to take this   PROTONIX 40 mg/20 mL Pack Generic drug:  pantoprazole sodium Place 40 mg into feeding tube daily before breakfast.   QUEtiapine 50 MG tablet Commonly known as:  SEROQUEL Place 50 mg into feeding tube every 12 (twelve) hours.   rivaroxaban 20 MG Tabs tablet Commonly known as:  XARELTO Take 20 mg by mouth daily with breakfast.   saccharomyces boulardii 250 MG capsule Commonly known as:  FLORASTOR Place 1 capsule (250 mg total) into feeding tube 2 (two) times daily.   sertraline 100 MG tablet Commonly known as:  ZOLOFT Place 100 mg into feeding tube daily before breakfast.   Sodium Chloride (Inhalant) 7 % Nebu 1 vial by Intratracheal route every 6 (six) hours.   Valproic  Acid 250 MG/5ML Soln Place 500 mg into feeding tube every 12 (twelve) hours.      Allergies  Allergen Reactions  . Codeine     Unknown reaction per MAR   . Keflex [Cephalexin]     Unknown reaction per MAR   . Ketorolac     Unknown reaction per [facility]    Follow-up Information    follow up with pmd Follow up on 04/21/2017.   Why:  at 3pm with Carren Rang hospital discharge follow up, repeat cbc/bmp at follow up. pmd to refer patient to pain clinic for chronic pain management.       follow up with ENT Follow up in 1 month(s).   Why:  for vocal cord dysfunction       follow up with gastroenterology Follow up in 1 month(s).   Why:  for GI dysmotility            The results of significant diagnostics from this hospitalization (including imaging, microbiology, ancillary and laboratory) are listed below for reference.    Significant Diagnostic Studies: Dg Abd 1 View  Result Date: 04/17/2017 CLINICAL DATA:  Feeding tube site pain this afternoon. EXAM: ABDOMEN - 1 VIEW COMPARISON:  CT of the abdomen and pelvis 04/15/2017 FINDINGS: Bowel gas pattern is nonobstructive. Residual contrast is identified within bowel loops following CT on 06/13. Surgical clips are identified in the right lower quadrant. No evidence for free intraperitoneal air. IMPRESSION: Nonobstructive bowel gas pattern. Electronically Signed   By: Norva Pavlov M.D.   On: 04/17/2017 18:16   Ct Head Wo Contrast  Result Date: 04/15/2017 CLINICAL DATA:  Aggressive behavior. Patient found on floor today. Hx of DKA ^140mL ISOVUE-300 IOPAMIDOL (ISOVUE-300) INJECTION 61% EXAM: CT HEAD WITHOUT CONTRAST TECHNIQUE: Contiguous axial images were obtained from the base of the skull through the vertex without intravenous contrast. COMPARISON:  None. FINDINGS: Brain: There is mild generalized parenchymal atrophy with commensurate dilatation of the ventricles and sulci. Mild chronic small vessel ischemic changes noted within  the deep periventricular white matter. There is no mass, hemorrhage, edema or other evidence of acute parenchymal abnormality. No extra-axial hemorrhage. Vascular: There are chronic calcified atherosclerotic changes of the large vessels at the skull base. No unexpected hyperdense vessel. Skull: Normal. Negative for fracture or focal lesion. Sinuses/Orbits: Mucosal thickening within the sphenoid sinus and right maxillary sinus, of uncertain chronicity. Chronic appearing mastoid fluid bilaterally. Periorbital and retro-orbital soft tissues are unremarkable. Other: None.  IMPRESSION: 1. No acute intracranial abnormality. No intracranial mass, hemorrhage or edema. 2. Mild paranasal sinus disease, of uncertain chronicity, most likely chronic. Electronically Signed   By: Bary Richard M.D.   On: 04/15/2017 15:24   Ct Chest W Contrast  Result Date: 04/18/2017 CLINICAL DATA:  Pneumonia and abdominal pain. EXAM: CT CHEST, ABDOMEN, AND PELVIS WITH CONTRAST TECHNIQUE: Multidetector CT imaging of the chest, abdomen and pelvis was performed following the standard protocol during bolus administration of intravenous contrast. CONTRAST:  ISOVUE-300 IOPAMIDOL (ISOVUE-300) INJECTION 61% COMPARISON:  CT scan 04/15/2017 FINDINGS: CT CHEST FINDINGS Cardiovascular: The heart is normal in size. No pericardial effusion. Mild tortuosity of the thoracic aorta but no dissection or focal aneurysm. No significant atherosclerotic calcifications. The branch vessels are patent. No definite coronary artery calcifications. A right-sided PICC line is noted. Mediastinum/Nodes: Small scattered mediastinal and hilar lymph nodes are stable. These are likely reactive/inflammatory. The esophagus is grossly normal. Lungs/Pleura: Persistent but improved bilateral patchy ground-glass opacities likely clearing/ improving infiltrates. Persistent airspace consolidation/focal pneumonia in the right lower lobe with air bronchograms, and not significantly  changed. No pleural effusion. No worrisome pulmonary lesions. Stable marked elevation of the right hemidiaphragm. Chest wall/ Musculoskeletal: No chest wall mass, supraclavicular or axillary lymphadenopathy. Small scattered lymph nodes are noted. The thyroid gland is grossly normal. Surgical changes involving the anterior upper tracheal region most likely due to a prior tracheostomy. No significant bony findings. CT ABDOMEN PELVIS FINDINGS Hepatobiliary: Stable mild intrahepatic biliary dilatation likely due to prior cholecystectomy. No focal hepatic lesions. No common bile duct dilatation. Pancreas: Significant atrophy of the pancreas. No obvious mass or acute inflammation. Spleen: Normal size.  No focal lesions. Adrenals/Urinary Tract: The adrenal glands and kidneys are unremarkable and stable. Stomach/Bowel: Surgical changes involving the stomach. A feeding gastrostomy tube is an good position without complicating features. The duodenum is grossly normal. No small bowel dilatation to suggest obstruction. Moderate contrast throughout the colon is noted. There is moderate wall thickening involving the descending colon, sigmoid colon and rectum suggesting an inflammatory or infectious colitis. The ascending and transverse colon are unremarkable. Vascular/Lymphatic: Scattered atherosclerotic calcifications involving the aorta and iliac arteries but no aneurysm or dissection. The branch vessels are patent. The major venous structures are patent. No mesenteric or retroperitoneal mass or adenopathy. Small scattered lymph nodes are stable. Reproductive: The prostate gland and seminal vesicles are unremarkable. Other: No pelvic mass or adenopathy. No free pelvic fluid collections. No inguinal mass or adenopathy. No abdominal wall hernia or subcutaneous lesions. Musculoskeletal: No significant bony findings. IMPRESSION: 1. Persistent but slightly improved multiple bilateral patchy airspace opacities, likely improving  infiltrates. 2. Persistent dense airspace consolidation in the right lower lobe consistent with pneumonia. 3. No mediastinal or hilar mass or adenopathy. Small scattered lymph nodes are stable. 4. Status post cholecystectomy with stable mild intrahepatic biliary dilatation. 5. Stable surgical changes involving the GE junction and stomach. Feeding gastrostomy tube is in good position without complicating features. 6. Moderate descending, sigmoid and rectal wall thickening suggesting an inflammatory or infectious colitis. Electronically Signed   By: Rudie Meyer M.D.   On: 04/18/2017 11:24   Ct Chest W Contrast  Result Date: 04/15/2017 CLINICAL DATA:  Status post fall last night x2. Altered mental status. EXAM: CT CHEST, ABDOMEN, AND PELVIS WITH CONTRAST TECHNIQUE: Multidetector CT imaging of the chest, abdomen and pelvis was performed following the standard protocol during bolus administration of intravenous contrast. CONTRAST:  100 ml ISOVUE-300 IOPAMIDOL (ISOVUE-300) INJECTION 61%  COMPARISON:  Single-view of the chest earlier today. FINDINGS: CT CHEST FINDINGS Cardiovascular: Heart size is normal. No pericardial effusion. A few scattered aortic atherosclerotic calcifications are identified. Right PICC is noted with its tip in the lower superior vena cava. Mediastinum/Nodes: No enlarged mediastinal, hilar or axillary lymph nodes. Thyroid gland, trachea, and esophagus demonstrate no significant findings. Lungs/Pleura: No pleural effusion. Patchy airspace disease is seen in the upper lobes bilaterally. There is dense right lower lobe airspace disease with air bronchograms. Micronodularity is seen and most conspicuous in the right upper lobe, right middle lobe and lingula. Scattered peribronchial thickening is also identified. Musculoskeletal: No acute bony abnormality is identified. 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 No lytic or sclerotic lesion. CT  ABDOMEN PELVIS FINDINGS Hepatobiliary: The patient is status post cholecystectomy. Mild intrahepatic biliary ductal dilatation is likely related to gallbladder removal. No focal liver lesion. Pancreas: The pancreas demonstrates some atrophy. No focal lesion or pancreatic duct dilatation. Spleen: Normal in size without focal abnormality. Adrenals/Urinary Tract: Adrenal glands are unremarkable. Kidneys are normal, without renal calculi, focal lesion, or hydronephrosis. Bladder is unremarkable. Stomach/Bowel: Feeding tube is in place with the tip in the stomach. The patient is status post gastric bypass without evidence complication. The colon appears normal. No evidence of appendicitis. Vascular/Lymphatic: Scattered atherosclerosis without aneurysm. No lymphadenopathy. Reproductive: Prostate is unremarkable. Other: No fluid collection. Metallic densities projecting in the right lower quadrant could be due to prior surgery or trauma. Musculoskeletal: No acute or focal bony abnormality. IMPRESSION: Extensive bilateral airspace disease worst in the right lower lobe consistent with pneumonia. Extensive micro nodularity bilaterally raise the possibility of atypical infection. No other acute abnormality chest, abdomen or pelvis. Atherosclerosis. Status post cholecystectomy and gastric bypass. Electronically Signed   By: Drusilla Kannerhomas  Dalessio M.D.   On: 04/15/2017 15:31   Ct Abdomen Pelvis W Contrast  Result Date: 04/18/2017 CLINICAL DATA:  Pneumonia and abdominal pain. EXAM: CT CHEST, ABDOMEN, AND PELVIS WITH CONTRAST TECHNIQUE: Multidetector CT imaging of the chest, abdomen and pelvis was performed following the standard protocol during bolus administration of intravenous contrast. CONTRAST:  100mL ISOVUE-300 IOPAMIDOL (ISOVUE-300) INJECTION 61% COMPARISON:  CT scan 04/15/2017 FINDINGS: CT CHEST FINDINGS Cardiovascular: The heart is normal in size. No pericardial effusion. Mild tortuosity of the thoracic aorta but no  dissection or focal aneurysm. No significant atherosclerotic calcifications. The branch vessels are patent. No definite coronary artery calcifications. A right-sided PICC line is noted. Mediastinum/Nodes: Small scattered mediastinal and hilar lymph nodes are stable. These are likely reactive/inflammatory. The esophagus is grossly normal. Lungs/Pleura: Persistent but improved bilateral patchy ground-glass opacities likely clearing/ improving infiltrates. Persistent airspace consolidation/focal pneumonia in the right lower lobe with air bronchograms, and not significantly changed. No pleural effusion. No worrisome pulmonary lesions. Stable marked elevation of the right hemidiaphragm. Chest wall/ Musculoskeletal: No chest wall mass, supraclavicular or axillary lymphadenopathy. Small scattered lymph nodes are noted. The thyroid gland is grossly normal. Surgical changes involving the anterior upper tracheal region most likely due to a prior tracheostomy. No significant bony findings. CT ABDOMEN PELVIS FINDINGS Hepatobiliary: Stable mild intrahepatic biliary dilatation likely due to prior cholecystectomy. No focal hepatic lesions. No common bile duct dilatation. Pancreas: Significant atrophy of the pancreas. No obvious mass or acute inflammation. Spleen: Normal size.  No focal lesions. Adrenals/Urinary Tract: The adrenal glands and kidneys are unremarkable and stable. Stomach/Bowel: Surgical changes involving the stomach. A feeding gastrostomy tube is an good position without  complicating features. The duodenum is grossly normal. No small bowel dilatation to suggest obstruction. Moderate contrast throughout the colon is noted. There is moderate wall thickening involving the descending colon, sigmoid colon and rectum suggesting an inflammatory or infectious colitis. The ascending and transverse colon are unremarkable. Vascular/Lymphatic: Scattered atherosclerotic calcifications involving the aorta and iliac arteries but no  aneurysm or dissection. The branch vessels are patent. The major venous structures are patent. No mesenteric or retroperitoneal mass or adenopathy. Small scattered lymph nodes are stable. Reproductive: The prostate gland and seminal vesicles are unremarkable. Other: No pelvic mass or adenopathy. No free pelvic fluid collections. No inguinal mass or adenopathy. No abdominal wall hernia or subcutaneous lesions. Musculoskeletal: No significant bony findings. IMPRESSION: 1. Persistent but slightly improved multiple bilateral patchy airspace opacities, likely improving infiltrates. 2. Persistent dense airspace consolidation in the right lower lobe consistent with pneumonia. 3. No mediastinal or hilar mass or adenopathy. Small scattered lymph nodes are stable. 4. Status post cholecystectomy with stable mild intrahepatic biliary dilatation. 5. Stable surgical changes involving the GE junction and stomach. Feeding gastrostomy tube is in good position without complicating features. 6. Moderate descending, sigmoid and rectal wall thickening suggesting an inflammatory or infectious colitis. Electronically Signed   By: Rudie Meyer M.D.   On: 04/18/2017 11:24   Ct Abdomen Pelvis W Contrast  Result Date: 04/15/2017 CLINICAL DATA:  Status post fall last night x2. Altered mental status. EXAM: CT CHEST, ABDOMEN, AND PELVIS WITH CONTRAST TECHNIQUE: Multidetector CT imaging of the chest, abdomen and pelvis was performed following the standard protocol during bolus administration of intravenous contrast. CONTRAST:  100 ml ISOVUE-300 IOPAMIDOL (ISOVUE-300) INJECTION 61% COMPARISON:  Single-view of the chest earlier today. FINDINGS: CT CHEST FINDINGS Cardiovascular: Heart size is normal. No pericardial effusion. A few scattered aortic atherosclerotic calcifications are identified. Right PICC is noted with its tip in the lower superior vena cava. Mediastinum/Nodes: No enlarged mediastinal, hilar or axillary lymph nodes. Thyroid  gland, trachea, and esophagus demonstrate no significant findings. Lungs/Pleura: No pleural effusion. Patchy airspace disease is seen in the upper lobes bilaterally. There is dense right lower lobe airspace disease with air bronchograms. Micronodularity is seen and most conspicuous in the right upper lobe, right middle lobe and lingula. Scattered peribronchial thickening is also identified. Musculoskeletal: No acute bony abnormality is identified. 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 No lytic or sclerotic lesion. CT ABDOMEN PELVIS FINDINGS Hepatobiliary: The patient is status post cholecystectomy. Mild intrahepatic biliary ductal dilatation is likely related to gallbladder removal. No focal liver lesion. Pancreas: The pancreas demonstrates some atrophy. No focal lesion or pancreatic duct dilatation. Spleen: Normal in size without focal abnormality. Adrenals/Urinary Tract: Adrenal glands are unremarkable. Kidneys are normal, without renal calculi, focal lesion, or hydronephrosis. Bladder is unremarkable. Stomach/Bowel: Feeding tube is in place with the tip in the stomach. The patient is status post gastric bypass without evidence complication. The colon appears normal. No evidence of appendicitis. Vascular/Lymphatic: Scattered atherosclerosis without aneurysm. No lymphadenopathy. Reproductive: Prostate is unremarkable. Other: No fluid collection. Metallic densities projecting in the right lower quadrant could be due to prior surgery or trauma. Musculoskeletal: No acute or focal bony abnormality. IMPRESSION: Extensive bilateral airspace disease worst in the right lower lobe consistent with pneumonia. Extensive micro nodularity bilaterally raise the possibility of atypical infection. No other acute abnormality chest, abdomen or pelvis. Atherosclerosis. Status post cholecystectomy and gastric bypass. Electronically Signed   By: Drusilla Kanner M.D.  On:  04/15/2017 15:31   Dg Chest Port 1 View  Result Date: 04/15/2017 CLINICAL DATA:  56 year old male with cough fever and weakness today. EXAM: PORTABLE CHEST 1 VIEW COMPARISON:  None. FINDINGS: Portable AP semi upright view at 1230 hours. Right PICC line is in place. There is confluent somewhat masslike opacity at the right lung base with evidence of right lung volume loss. The patient is also mildly rotated to the right. No pneumothorax or pulmonary edema. The left lung is clear allowing for portable technique. Mild if any cardiomegaly. Other mediastinal contours are within normal limits. Visualized tracheal air column is within normal limits. Epigastric and right upper quadrant surgical clips. Negative visible bowel gas pattern. IMPRESSION: 1. A right PICC line is in place. 2. Abnormal right lung base, but nonspecific. There is evidence of some right lung volume loss. This could reflect a combination of any of the following: Pneumonia, lung mass, pleural effusion, lower lobe collapse. Chest CT (IV contrast preferred) would characterize further. 3. Left lung appears clear. Electronically Signed   By: Odessa Fleming M.D.   On: 04/15/2017 13:02   Dg Swallowing Func-speech Pathology  Result Date: 04/17/2017 Objective Swallowing Evaluation: Type of Study: MBS-Modified Barium Swallow Study Patient Details Name: Takeru Bose MRN: [id number] Date of Birth: 12/16/1960 Today's Date: 04/17/2017 Time: SLP Start Time (ACUTE ONLY): 1240-SLP Stop Time (ACUTE ONLY): 1310 SLP Time Calculation (min) (ACUTE ONLY): 30 min Past Medical History: Past Medical History: Diagnosis Date . Bipolar disorder (HCC)  . C. difficile diarrhea  . Diabetes mellitus without complication (HCC)  . DKA (diabetic ketoacidoses) (HCC)  . Metabolic encephalopathy  . Pressure ulcer  . Respiratory failure (HCC)  Past Surgical History: No past surgical history on file. HPI: Ekin Penickis a 56 y.o.malewith a past medical history of chronic respiratory failure,  tracheostomy, PEG tube, DVT, diabetes, bipolar disorder who was at the [facility] and who was sent over here due to bizarre behavior over the last few days. Pt [facility] to [facility] from another facility [facility]. He had to be intubated. Could not be weaned off and had to have a tracheostomy tube placed. He also has a PEG tube. At the [facility] facility he was weaned off of his ventilator. He has been decannulated. 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 Has had multiple FEES with no improvement from therapeutic interventions. Has been evaluated by ENT. Reportedly he also drink from the faucet or the saline in the room. Pt noted to have left lingual deviation, he reports a history of stroke, but this is not in his chart. No Data Recorded Assessment / Plan / Recommendation [facility] IP CLINICAL IMPRESSIONS 04/17/2017 Clinical Impression Pt demonstrates a severe oropharygneal and esophageal dysphagia with concern for neuromuscular impairment impacting CN XII and X as well as known vocal fold injury/paralysis. There is lingual deviation to the left, reduced hyolaryngeal elevation, excursion, epiglottic deflection and UES opening as well as appearance of poor negative pharyngeal pressure to pull bolus through pharynx. Liquids boluses spill to the pyriforms and is pushed into the vestibule and falls past the cords as the swallow is initiated, without sensation. Puree is not aspirated, but stasis is severe. A deep chin tuck, with neck fully horizontal (slightly forward leaning) improves bolus transit with minimal residuals with puree and solids. Of note, there is appearance of minimal esophageal persitalsis  and after about 2 oz of solids, residuals increase due to esophageal backflow. Recommend pt initaite trials of puree and pudding with a deep chin tuck with full  SLP supervision. If this is tolerated, could consider allowing small trials of solids if well masticated under supervision (pt really wants a hamburger). No liquids recommended due to severity of aspiration. Will follow for tolerance.  SLP Visit Diagnosis Dysphagia, oropharyngeal phase (R13.12) Attention and concentration deficit following -- Frontal lobe and executive function deficit following -- Impact on safety and function Severe aspiration risk   [facility] IP TREATMENT RECOMMENDATION 04/17/2017 Treatment Recommendations Therapy as outlined in treatment plan below   No flowsheet data found. [facility] IP DIET RECOMMENDATION 04/17/2017 SLP Diet Recommendations Dysphagia 1 (Puree) solids Liquid Administration via -- Medication Administration -- Compensations -- Postural Changes --   [facility] IP OTHER RECOMMENDATIONS 04/17/2017 Recommended Consults -- Oral Care Recommendations Oral care QID Other Recommendations --   [facility] IP FOLLOW UP RECOMMENDATIONS 04/17/2017 Follow up Recommendations Skilled Nursing facility   [facility] IP FREQUENCY AND DURATION 04/17/2017 Speech Therapy Frequency (ACUTE ONLY) min 2x/week Treatment Duration 2 weeks      [facility] IP ORAL PHASE 04/17/2017 Oral Phase WFL Oral - Pudding Teaspoon -- Oral - Pudding Cup -- Oral - Honey Teaspoon -- Oral - Honey Cup -- Oral - Nectar Teaspoon -- Oral - Nectar Cup -- Oral - Nectar Straw -- Oral - Thin Teaspoon -- Oral - Thin Cup -- Oral - Thin Straw -- Oral - Puree -- Oral - Mech Soft -- Oral - Regular -- Oral - Multi-Consistency -- Oral - Pill -- Oral Phase - Comment --  [facility] IP PHARYNGEAL PHASE 04/17/2017 Pharyngeal Phase Impaired Pharyngeal- Pudding Teaspoon -- Pharyngeal -- Pharyngeal- Pudding Cup -- Pharyngeal -- Pharyngeal- Honey Teaspoon Reduced laryngeal elevation;Reduced airway/laryngeal closure;Reduced anterior laryngeal mobility;Reduced epiglottic inversion;Reduced pharyngeal peristalsis;Penetration/Aspiration during swallow;Penetration/Aspiration before swallow;Significant  aspiration (Amount);Pharyngeal residue - valleculae;Pharyngeal residue - pyriform;Compensatory strategies attempted (with notebox) Pharyngeal Material enters airway, passes BELOW cords without attempt by patient to eject out (silent aspiration) Pharyngeal- Honey Cup -- Pharyngeal -- Pharyngeal- Nectar Teaspoon Reduced laryngeal elevation;Reduced airway/laryngeal closure;Reduced anterior laryngeal mobility;Reduced epiglottic inversion;Reduced pharyngeal peristalsis;Penetration/Aspiration during swallow;Penetration/Aspiration before swallow;Significant aspiration (Amount);Pharyngeal residue - valleculae;Pharyngeal residue - pyriform;Compensatory strategies attempted (with notebox) Pharyngeal Material enters airway, passes BELOW cords without attempt by patient to eject out (silent aspiration) Pharyngeal- Nectar Cup -- Pharyngeal -- Pharyngeal- Nectar Straw -- Pharyngeal -- Pharyngeal- Thin Teaspoon -- Pharyngeal -- Pharyngeal- Thin Cup -- Pharyngeal -- Pharyngeal- Thin Straw -- Pharyngeal -- Pharyngeal- Puree Reduced pharyngeal peristalsis;Reduced epiglottic inversion;Reduced anterior laryngeal mobility;Reduced laryngeal elevation;Reduced airway/laryngeal closure;Reduced tongue base retraction;Pharyngeal residue - pyriform;Pharyngeal residue - valleculae Pharyngeal -- Pharyngeal- Mechanical Soft -- Pharyngeal -- Pharyngeal- Regular -- Pharyngeal -- Pharyngeal- Multi-consistency -- Pharyngeal -- Pharyngeal- Pill -- Pharyngeal -- Pharyngeal Comment --  [facility] IP CERVICAL ESOPHAGEAL PHASE 04/17/2017 Cervical Esophageal Phase Impaired Pudding Teaspoon -- Pudding Cup -- Honey Teaspoon Reduced cricopharyngeal relaxation;Esophageal backflow into cervical esophagus;Esophageal backflow into the pharynx Honey Cup -- Nectar Teaspoon Reduced cricopharyngeal relaxation;Esophageal backflow into cervical esophagus;Esophageal backflow into the pharynx Nectar Cup -- Nectar Straw -- Thin Teaspoon -- Thin Cup -- Thin Straw -- Puree Reduced  cricopharyngeal relaxation;Esophageal backflow into cervical esophagus;Esophageal backflow into the pharynx Mechanical Soft -- Regular Reduced cricopharyngeal relaxation;Esophageal backflow into cervical esophagus;Esophageal backflow into the pharynx Multi-consistency -- Pill -- Cervical Esophageal Comment severe esophageal stasis with puree No flowsheet data found. Harlon Ditty, [place] CCC-SLP [phone] Claudine Mouton 04/17/2017, 2:32 PM  Microbiology: Recent Results (from the past 240 hour(s))  Urine culture     Status: Abnormal   Collection Time: 04/15/17 11:34 AM  Result Value Ref Range Status   Specimen Description URINE, CLEAN CATCH  Final   Special Requests NONE  Final   Culture 20,000 COLONIES/mL ESCHERICHIA COLI (A)  Final   Report Status 04/17/2017 FINAL  Final   Organism ID, Bacteria ESCHERICHIA COLI (A)  Final      Susceptibility   Escherichia coli - MIC*    AMPICILLIN 16 INTERMEDIATE Intermediate     CEFAZOLIN <=4 SENSITIVE Sensitive     CEFTRIAXONE <=1 SENSITIVE Sensitive     CIPROFLOXACIN <=0.25 SENSITIVE Sensitive     GENTAMICIN <=1 SENSITIVE Sensitive     IMIPENEM <=0.25 SENSITIVE Sensitive     NITROFURANTOIN <=16 SENSITIVE Sensitive     TRIMETH/SULFA <=20 SENSITIVE Sensitive     AMPICILLIN/SULBACTAM 4 SENSITIVE Sensitive     PIP/TAZO <=4 SENSITIVE Sensitive     Extended ESBL NEGATIVE Sensitive     * 20,000 COLONIES/mL ESCHERICHIA COLI  Blood culture (routine x 2)     Status: None (Preliminary result)   Collection Time: 04/15/17 12:40 PM  Result Value Ref Range Status   Specimen Description BLOOD PICC LINE  Final   Special Requests   Final    BOTTLES DRAWN AEROBIC AND ANAEROBIC Blood Culture adequate volume   Culture   Final    NO GROWTH 4 DAYS Performed at [facility], [address], [place], [place] [address]    Report Status PENDING  Incomplete  Blood culture (routine x 2)     Status: None (Preliminary result)   Collection Time:  04/15/17 12:40 PM  Result Value Ref Range Status   Specimen Description BLOOD RIGHT FOREARM  Final   Special Requests   Final    BOTTLES DRAWN AEROBIC AND ANAEROBIC Blood Culture adequate volume   Culture   Final    NO GROWTH 4 DAYS Performed at [facility], [address], [place], [place] [address]    Report Status PENDING  Incomplete  Culture, expectorated sputum-assessment     Status: None   Collection Time: 04/16/17 11:28 AM  Result Value Ref Range Status   Specimen Description EXPECTORATED SPUTUM  Final   Special Requests NONE  Final   Sputum evaluation   Final    Sputum specimen not acceptable for testing.  Please recollect.   Gram Stain Report Called to,Read Back By and Verified With: D LOCK,RN @1238  04/16/17 MKELLY    Report Status 04/16/2017 FINAL  Final  MRSA PCR Screening     Status: None   Collection Time: 04/16/17 11:30 AM  Result Value Ref Range Status   MRSA by PCR NEGATIVE NEGATIVE Final    Comment:        The GeneXpert MRSA Assay (FDA approved for NASAL specimens only), is one component of a comprehensive MRSA colonization surveillance program. It is not intended to diagnose MRSA infection nor to guide or monitor treatment for MRSA infections.      Labs: Basic Metabolic Panel:  Recent Labs Lab 04/16/17 0819 04/17/17 0456 04/18/17 0410 04/19/17 0430 04/20/17 0309  NA 141 138 138 137 139  K 3.7 3.5 3.8 4.1 3.7  CL 104 104 106 105 107  CO2 28 26 28 25 27   GLUCOSE 129* 116* 172* 142* 125*  BUN 19 16 13 9 8   CREATININE 0.40* 0.44* 0.33* 0.39* 0.42*  CALCIUM 8.9 8.6* 8.4*  8.4* 8.5*  MG  --   --  1.5* 1.8 1.7   Liver Function Tests:  Recent Labs Lab 04/15/17 1159 04/16/17 0819  AST 17 18  ALT 16* 13*  ALKPHOS 59 58  BILITOT 0.4 0.6  PROT 7.3 7.5  ALBUMIN 3.1* 3.3*   No results for input(s): LIPASE, AMYLASE in the last 168 hours. No results for input(s): AMMONIA in the last 168 hours. CBC:  Recent Labs Lab 04/15/17 1159  04/16/17 0819 04/17/17 0456 04/18/17 0410 04/19/17 0430 04/20/17 0309  WBC 16.7* 10.9* 8.6 8.2 12.9* 9.6  NEUTROABS 14.3*  --  5.0 4.2  --  5.8  HGB 9.8* 8.4* 8.8* 8.6* 9.2* 9.0*  HCT 31.5* 26.5* 28.2* 27.3* 29.4* 28.9*  MCV 85.8 86.9 85.7 86.9 87.0 87.8  PLT 297 321 264 254 315 310   Cardiac Enzymes: No results for input(s): CKTOTAL, CKMB, CKMBINDEX, TROPONINI in the last 168 hours. BNP: BNP (last 3 results) No results for input(s): BNP in the last 8760 hours.  ProBNP (last 3 results) No results for input(s): PROBNP in the last 8760 hours.  CBG:  Recent Labs Lab 04/19/17 0735 04/19/17 1229 04/19/17 1740 04/19/17 2205 04/20/17 0811  GLUCAP 140* 111* 106* 113* 108*       Signed:  Tylie Golonka MD, PhD  [facility] 04/20/2017, 3:10 PM

## 2017-04-20 NOTE — Progress Notes (Signed)
Date: April 20, 2017  Call to Daughter Lamar LaundrySonya- message to please return call, concerning pending dc from hospital. Marcelle Smilinghonda Kellyanne Ellwanger, BSN, RN3, CCM:  [phone]

## 2017-04-20 NOTE — Progress Notes (Signed)
Went over tube feed administration and set-up with daughter and patient.  Both verbalized understanding.

## 2017-04-20 NOTE — Progress Notes (Signed)
Physical Therapy Treatment Patient Details Name: Cory Montoya MRN: [id number] DOB: [birth year] Today's Date: 04/20/2017    History of Present Illness 56 y.o. male with a past medical history of chronic respiratory failure, tracheostomy, PEG tube, DVT, diabetes, bipolar disorder who was at [facility] (pt had been [facility]) where he was recently extubated and who was admitted here due to bizarre behavior over the last few days. Dx of PNA.     PT Comments    Pt progressing well with mobility, trial of cane with ambulation today. Pt ambulated 180' with SPC with 2 minor wobbles, but no overt loss of balance, pt able to self correct.   Follow Up Recommendations  Supervision for mobility/OOB;     Nurse, learning disability (pt requests triangle based cane)    Recommendations for Other Services       Precautions / Restrictions Precautions Precautions: Fall Precaution Comments: CVA L hemi Restrictions Weight Bearing Restrictions: No    Mobility  Bed Mobility               General bed mobility comments: OOB in recliner  Transfers Overall transfer level: Needs assistance Equipment used: None Transfers: Sit to/from Stand Sit to Stand: Min guard         General transfer comment: minGuard from recliner, used momentum, did no use armrests, pushed up from seat with BUEs, stated armrests are too high for him  Ambulation/Gait Ambulation/Gait assistance: Supervision Ambulation Distance (Feet): 180 Feet Assistive device: Straight cane Gait Pattern/deviations: Step-through pattern;Decreased stride length;Decreased weight shift to left Gait velocity: decreased   General Gait Details: trial with SPC, a few minor wobbles but no LOB   Stairs            Wheelchair Mobility    Modified Rankin (Stroke Patients Only)       Balance     Sitting balance-Leahy Scale: Good       Standing balance-Leahy Scale: Fair                               Cognition Arousal/Alertness: Awake/alert Behavior During Therapy: WFL for tasks assessed/performed Overall Cognitive Status: Within Functional Limits for tasks assessed                                 General Comments: difficult to understand s/p trach reversal plus vocal cord paralysis      Exercises      General Comments        Pertinent Vitals/Pain Pain Score: 7  Pain Location: abdomen, feet Pain Descriptors / Indicators: Aching Pain Intervention(s): Premedicated before session;Monitored during session;Limited activity within patient's tolerance;Patient requesting pain meds-RN notified    Home Living                      Prior Function            PT Goals (current goals can now be found in the care plan section) Acute Rehab PT Goals Patient Stated Goal: get stronger, spend time with grandkids PT Goal Formulation: With patient Time For Goal Achievement: 04/30/17 Potential to Achieve Goals: Good Progress towards PT goals: Progressing toward goals    Frequency    Min 3X/week      PT Plan      Co-evaluation  AM-PAC PT "6 Clicks" Daily Activity  Outcome Measure  Difficulty turning over in bed (including adjusting bedclothes, sheets and blankets)?: A Lot Difficulty moving from lying on back to sitting on the side of the bed? : A Lot Difficulty sitting down on and standing up from a chair with arms (e.g., wheelchair, bedside commode, etc,.)?: A Lot Help needed moving to and from a bed to chair (including a wheelchair)?: A Little Help needed walking in hospital room?: A Little Help needed climbing 3-5 steps with a railing? : A Lot 6 Click Score: 14    End of Session Equipment Utilized During Treatment: Gait belt Activity Tolerance: Patient tolerated treatment well;No increased pain Patient left: with call bell/phone within reach;in bed Nurse Communication: Mobility status PT Visit  Diagnosis: Other abnormalities of gait and mobility (R26.89)     Time: [id number] PT Time Calculation (min) (ACUTE ONLY): 13 min  Charges:  $Gait Training: 8-22 mins                    G Codes:          Cory Montoya, Cory Montoya 04/20/2017, 1:51 PM [id number]

## 2017-04-20 NOTE — Progress Notes (Signed)
Date:  April 20, 2017  Chart reviewed for concurrent status and case management needs.  Will continue to follow patient progress.  Discharge Planning: following for needs  Expected discharge date: [id number]  Kanoelani Dobies, BSN, RN3, CCM   [phone]  

## 2017-04-20 NOTE — Progress Notes (Addendum)
Date:  April 20, 2017  [facility]. TCT-Time with [facility] will evaluate and call back. 1331/notified by [facility] at they will be able to do hhc.  Daughter notified to come and pick up father to take home. 1535/discharge summary faxed to pmd. Marcelle Smilinghonda Davis, BSN, RN3, CCM   [phone]

## 2017-04-20 NOTE — Progress Notes (Signed)
Page. Rosalita ChessmanM 1504 [place] is requesting percocet instead of morphine for pain but his percocet is only ordered Q12

## 2018-11-28 IMAGING — CT CT HEAD W/O CM
3 of 4 series · 14 of 47 positions shown, 16 images · IV contrast (iopamidol)
Comparison: None.

CLINICAL DATA: Aggressive behavior. Patient found on floor today.
Hx of DKA ^100mL [id number] IOPAMIDOL (JESBJX-HQQ) INJECTION 61%

EXAM:
CT HEAD WITHOUT CONTRAST
TECHNIQUE: Contiguous axial images were obtained from the base of the skull
through the vertex without intravenous contrast.

[Series 2: head w/o · axial · non-contrast · 0.49mm/px · z∈[+1641,+1771]mm · 8 of 32 slices shown, 10 images]
[im 3/32  brain]
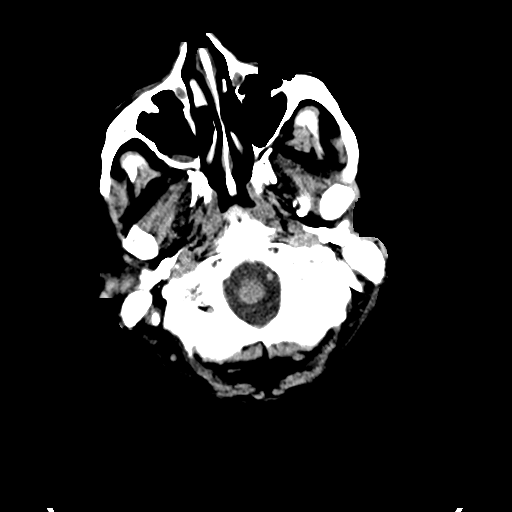
[im 3/32  bone]
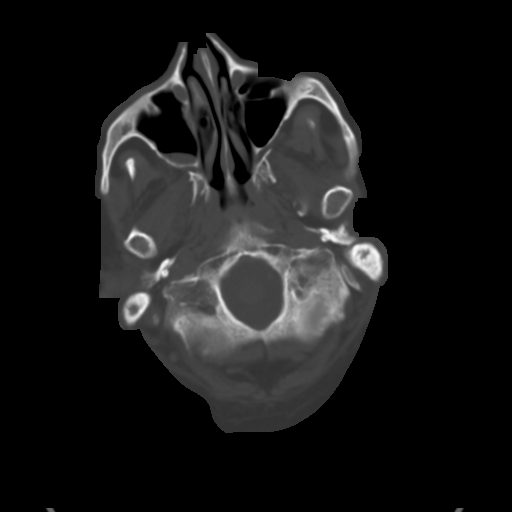
[im 7/32  brain]
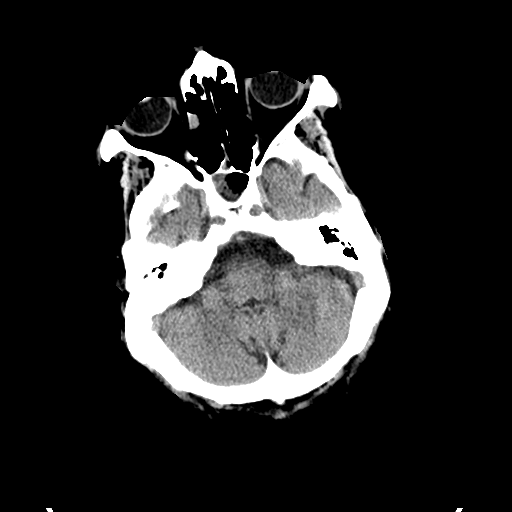
[im 12/32  brain]
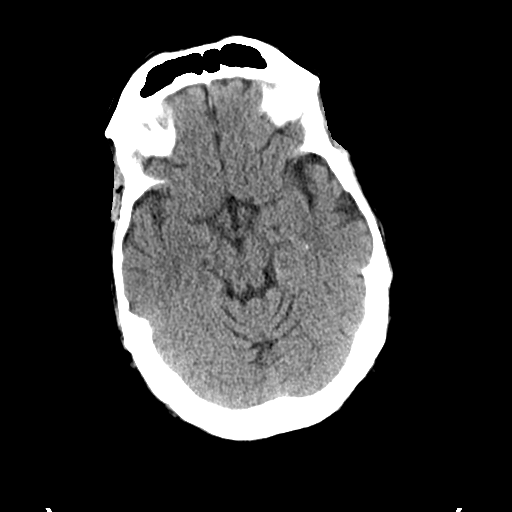
[im 14/32  brain]
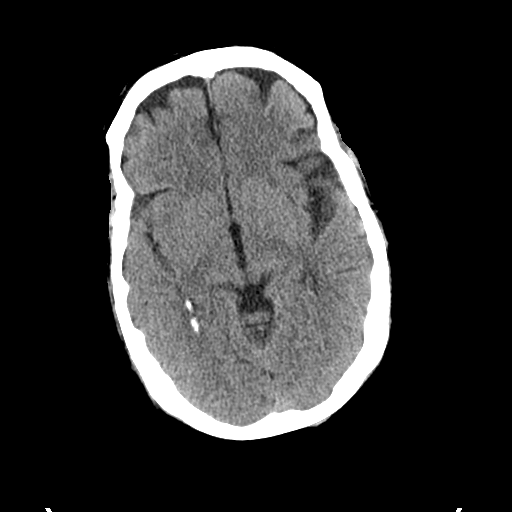
[im 18/32  brain]
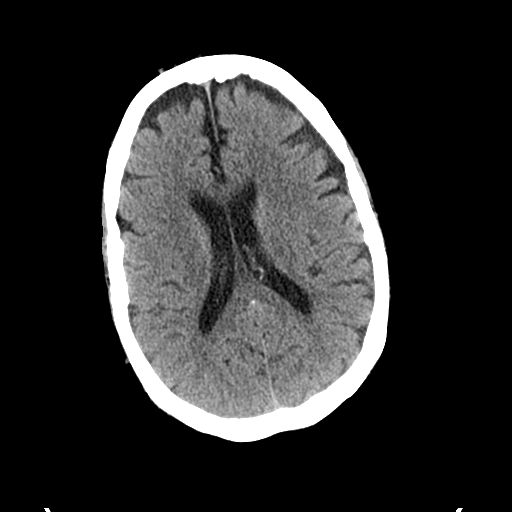
[im 18/32  bone]
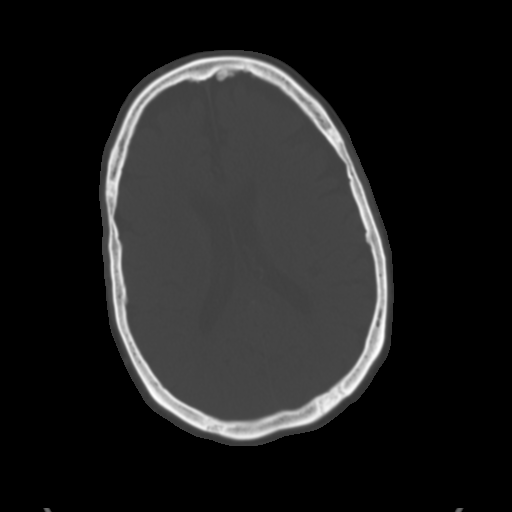
[im 20/32  brain]
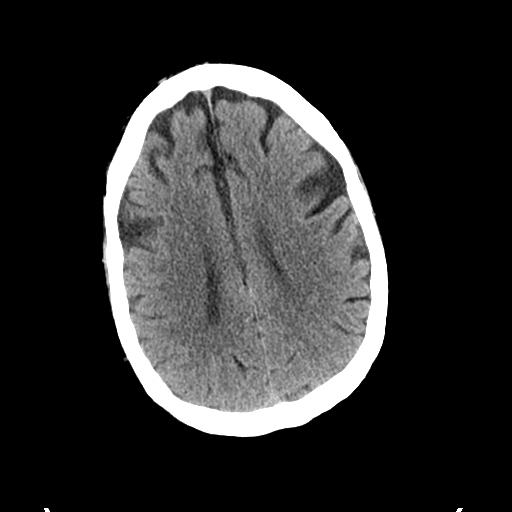
[im 25/32  brain]
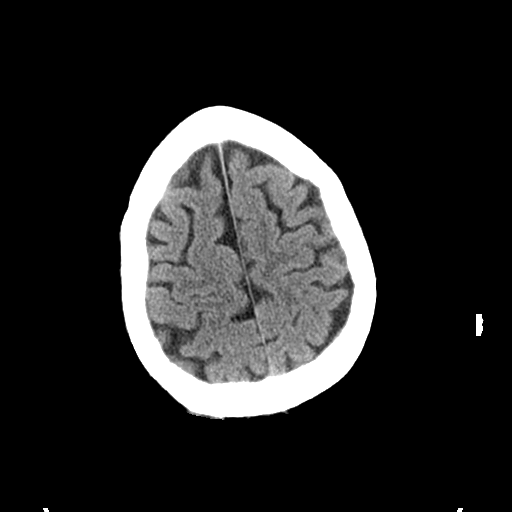
[im 29/32  brain]
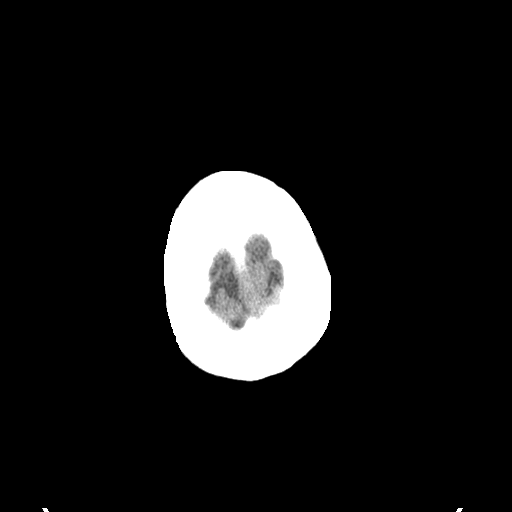

[Series 4: coronal · coronal · 0.30mm/px · 3 of 78 slices shown]
[im 26/78  brain]
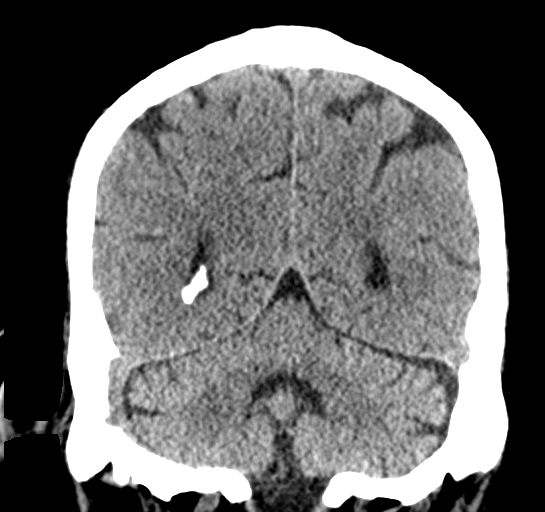
[im 35/78  brain]
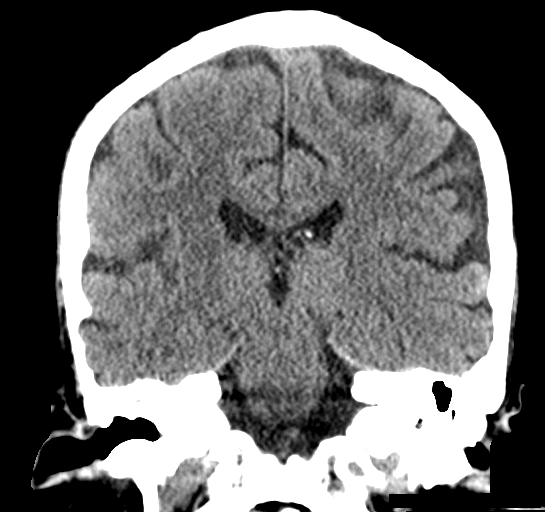
[im 43/78  brain]
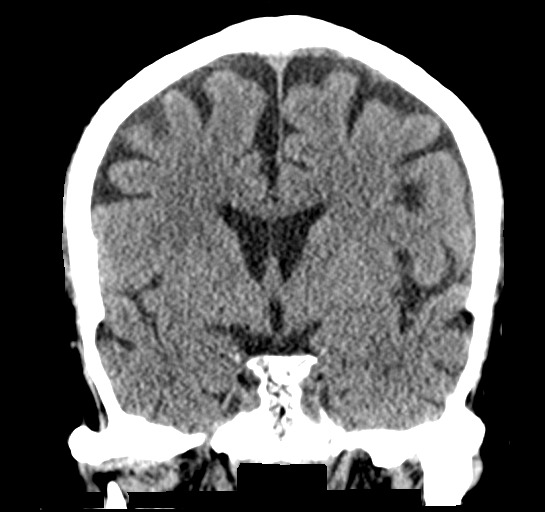

[Series 5: sagittal · sagittal · 0.30mm/px · 3 of 74 slices shown]
[im 25/74  brain]
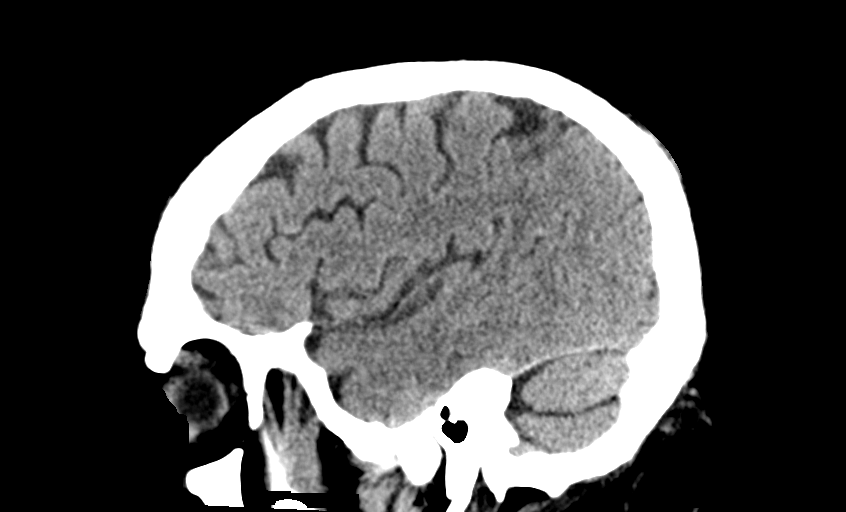
[im 37/74  brain]
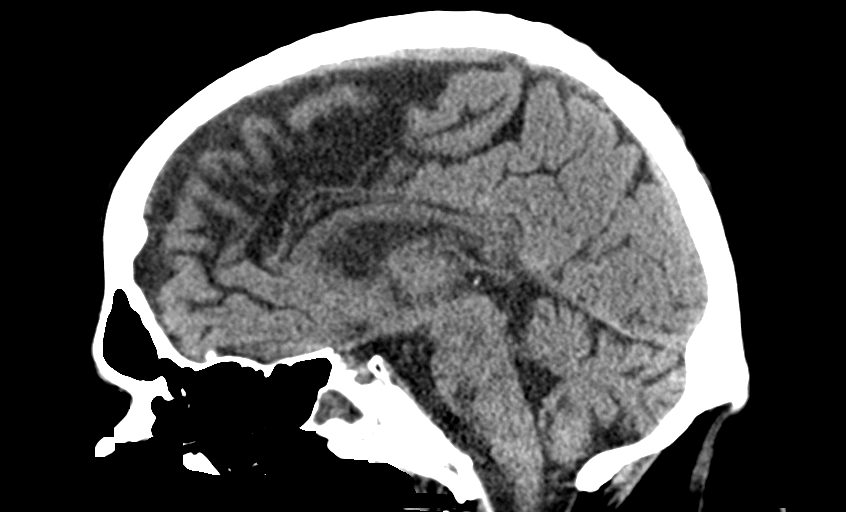
[im 49/74  brain]
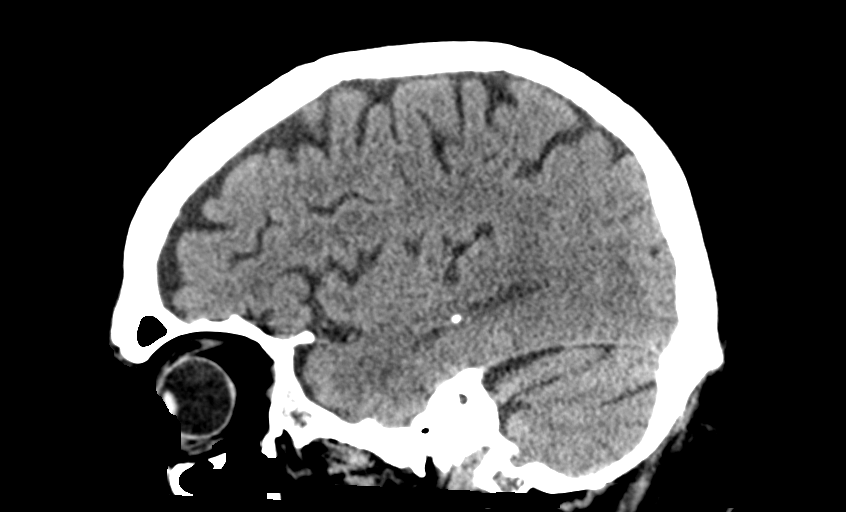

[14 of 47 positions shown; findings below may reference images not displayed]

FINDINGS: Brain: There is mild generalized parenchymal atrophy with
commensurate dilatation of the ventricles and sulci. Mild chronic
small vessel ischemic changes noted within the deep periventricular
white matter.

There is no mass, hemorrhage, edema or other evidence of acute
parenchymal abnormality. No extra-axial hemorrhage.

Vascular: There are chronic calcified atherosclerotic changes of the
large vessels at the skull base. No unexpected hyperdense vessel.

Skull: Normal. Negative for fracture or focal lesion.

Sinuses/Orbits: Mucosal thickening within the sphenoid sinus and
right maxillary sinus, of uncertain chronicity. Chronic appearing
mastoid fluid bilaterally.

Periorbital and retro-orbital soft tissues are unremarkable.

Other: None.
IMPRESSION: 1. No acute intracranial abnormality. No intracranial mass,
hemorrhage or edema.
2. Mild paranasal sinus disease, of uncertain chronicity, most
likely chronic.
